# Patient Record
Sex: Male | Born: 1940 | Race: Black or African American | Hispanic: No | Marital: Married | Smoking: Former smoker
Health system: Southern US, Community
[De-identification: ages and names within clinical notes are randomized; demographics above are authoritative.]

## PROBLEM LIST (undated history)

## (undated) DIAGNOSIS — I10 Essential (primary) hypertension: Secondary | ICD-10-CM

---

## 1998-03-23 ENCOUNTER — Other Ambulatory Visit: Admission: RE | Admit: 1998-03-23 | Discharge: 1998-03-23 | Payer: Self-pay | Admitting: Family Medicine

## 2008-04-01 ENCOUNTER — Ambulatory Visit (HOSPITAL_COMMUNITY): Admission: RE | Admit: 2008-04-01 | Discharge: 2008-04-01 | Payer: Self-pay | Admitting: Family Medicine

## 2017-05-18 ENCOUNTER — Emergency Department (HOSPITAL_COMMUNITY): Payer: Medicare Other

## 2017-05-18 ENCOUNTER — Inpatient Hospital Stay (HOSPITAL_COMMUNITY): Payer: Medicare Other

## 2017-05-18 ENCOUNTER — Inpatient Hospital Stay (HOSPITAL_COMMUNITY)
Admission: EM | Admit: 2017-05-18 | Discharge: 2017-05-26 | DRG: 689 | Disposition: A | Discharge status: Home / Self Care | Payer: Medicare Other | Attending: Family Medicine | Admitting: Family Medicine

## 2017-05-18 ENCOUNTER — Encounter (HOSPITAL_COMMUNITY): Payer: Self-pay

## 2017-05-18 DIAGNOSIS — R32 Unspecified urinary incontinence: Secondary | ICD-10-CM

## 2017-05-18 DIAGNOSIS — R531 Weakness: Secondary | ICD-10-CM

## 2017-05-18 DIAGNOSIS — N178 Other acute kidney failure: Secondary | ICD-10-CM | POA: Diagnosis not present

## 2017-05-18 DIAGNOSIS — N184 Chronic kidney disease, stage 4 (severe): Secondary | ICD-10-CM | POA: Diagnosis present

## 2017-05-18 DIAGNOSIS — D62 Acute posthemorrhagic anemia: Secondary | ICD-10-CM | POA: Diagnosis not present

## 2017-05-18 DIAGNOSIS — Y92009 Unspecified place in unspecified non-institutional (private) residence as the place of occurrence of the external cause: Secondary | ICD-10-CM | POA: Diagnosis not present

## 2017-05-18 DIAGNOSIS — M1610 Unilateral primary osteoarthritis, unspecified hip: Secondary | ICD-10-CM | POA: Diagnosis present

## 2017-05-18 DIAGNOSIS — N179 Acute kidney failure, unspecified: Secondary | ICD-10-CM | POA: Diagnosis present

## 2017-05-18 DIAGNOSIS — I129 Hypertensive chronic kidney disease with stage 1 through stage 4 chronic kidney disease, or unspecified chronic kidney disease: Secondary | ICD-10-CM | POA: Diagnosis present

## 2017-05-18 DIAGNOSIS — R319 Hematuria, unspecified: Secondary | ICD-10-CM

## 2017-05-18 DIAGNOSIS — Y846 Urinary catheterization as the cause of abnormal reaction of the patient, or of later complication, without mention of misadventure at the time of the procedure: Secondary | ICD-10-CM | POA: Diagnosis present

## 2017-05-18 DIAGNOSIS — N3001 Acute cystitis with hematuria: Principal | ICD-10-CM | POA: Diagnosis present

## 2017-05-18 DIAGNOSIS — T8383XA Hemorrhage of genitourinary prosthetic devices, implants and grafts, initial encounter: Secondary | ICD-10-CM | POA: Diagnosis present

## 2017-05-18 DIAGNOSIS — B9620 Unspecified Escherichia coli [E. coli] as the cause of diseases classified elsewhere: Secondary | ICD-10-CM | POA: Diagnosis present

## 2017-05-18 DIAGNOSIS — W19XXXA Unspecified fall, initial encounter: Secondary | ICD-10-CM | POA: Diagnosis not present

## 2017-05-18 DIAGNOSIS — E43 Unspecified severe protein-calorie malnutrition: Secondary | ICD-10-CM | POA: Diagnosis present

## 2017-05-18 DIAGNOSIS — I10 Essential (primary) hypertension: Secondary | ICD-10-CM | POA: Diagnosis not present

## 2017-05-18 DIAGNOSIS — Z681 Body mass index (BMI) 19 or less, adult: Secondary | ICD-10-CM | POA: Diagnosis not present

## 2017-05-18 DIAGNOSIS — E860 Dehydration: Secondary | ICD-10-CM | POA: Diagnosis present

## 2017-05-18 DIAGNOSIS — Z79899 Other long term (current) drug therapy: Secondary | ICD-10-CM | POA: Diagnosis not present

## 2017-05-18 DIAGNOSIS — N3091 Cystitis, unspecified with hematuria: Secondary | ICD-10-CM

## 2017-05-18 DIAGNOSIS — L8990 Pressure ulcer of unspecified site, unspecified stage: Secondary | ICD-10-CM | POA: Insufficient documentation

## 2017-05-18 DIAGNOSIS — D696 Thrombocytopenia, unspecified: Secondary | ICD-10-CM | POA: Diagnosis not present

## 2017-05-18 DIAGNOSIS — E872 Acidosis: Secondary | ICD-10-CM | POA: Diagnosis present

## 2017-05-18 DIAGNOSIS — N390 Urinary tract infection, site not specified: Secondary | ICD-10-CM | POA: Diagnosis not present

## 2017-05-18 HISTORY — DX: Essential (primary) hypertension: I10

## 2017-05-18 LAB — CBC WITH DIFFERENTIAL/PLATELET
Basophils Absolute: 0 10*3/uL (ref 0.0–0.1)
Basophils Relative: 0 %
Eosinophils Absolute: 0 10*3/uL (ref 0.0–0.7)
Eosinophils Relative: 0 %
HCT: 31.7 % — ABNORMAL LOW (ref 39.0–52.0)
HEMOGLOBIN: 10.6 g/dL — AB (ref 13.0–17.0)
LYMPHS ABS: 0.9 10*3/uL (ref 0.7–4.0)
LYMPHS PCT: 12 %
MCH: 29.4 pg (ref 26.0–34.0)
MCHC: 33.4 g/dL (ref 30.0–36.0)
MCV: 87.8 fL (ref 78.0–100.0)
Monocytes Absolute: 0.4 10*3/uL (ref 0.1–1.0)
Monocytes Relative: 5 %
Neutro Abs: 6.1 10*3/uL (ref 1.7–7.7)
Neutrophils Relative %: 82 %
Platelets: 75 10*3/uL — ABNORMAL LOW (ref 150–400)
RBC: 3.61 MIL/uL — AB (ref 4.22–5.81)
RDW: 14 % (ref 11.5–15.5)
WBC: 7.4 10*3/uL (ref 4.0–10.5)

## 2017-05-18 LAB — URINALYSIS, ROUTINE W REFLEX MICROSCOPIC
Bilirubin Urine: NEGATIVE
Glucose, UA: NEGATIVE mg/dL
KETONES UR: NEGATIVE mg/dL
Nitrite: NEGATIVE
PROTEIN: 100 mg/dL — AB
SQUAMOUS EPITHELIAL / LPF: NONE SEEN
Specific Gravity, Urine: 1.012 (ref 1.005–1.030)
pH: 5 (ref 5.0–8.0)

## 2017-05-18 LAB — CBC
HEMATOCRIT: 33.9 % — AB (ref 39.0–52.0)
Hemoglobin: 11.4 g/dL — ABNORMAL LOW (ref 13.0–17.0)
MCH: 29.5 pg (ref 26.0–34.0)
MCHC: 33.6 g/dL (ref 30.0–36.0)
MCV: 87.8 fL (ref 78.0–100.0)
PLATELETS: 88 10*3/uL — AB (ref 150–400)
RBC: 3.86 MIL/uL — AB (ref 4.22–5.81)
RDW: 14.1 % (ref 11.5–15.5)
WBC: 9.8 10*3/uL (ref 4.0–10.5)

## 2017-05-18 LAB — COMPREHENSIVE METABOLIC PANEL
ALBUMIN: 3.6 g/dL (ref 3.5–5.0)
ALT: 15 U/L — AB (ref 17–63)
AST: 19 U/L (ref 15–41)
Alkaline Phosphatase: 60 U/L (ref 38–126)
Anion gap: 11 (ref 5–15)
BUN: 72 mg/dL — AB (ref 6–20)
CHLORIDE: 108 mmol/L (ref 101–111)
CO2: 17 mmol/L — ABNORMAL LOW (ref 22–32)
CREATININE: 5.27 mg/dL — AB (ref 0.61–1.24)
Calcium: 9.3 mg/dL (ref 8.9–10.3)
GFR calc Af Amer: 11 mL/min — ABNORMAL LOW (ref >60)
GFR, EST NON AFRICAN AMERICAN: 10 mL/min — AB (ref >60)
GLUCOSE: 94 mg/dL (ref 65–99)
POTASSIUM: 4.2 mmol/L (ref 3.5–5.1)
Sodium: 136 mmol/L (ref 135–145)
Total Bilirubin: 1.1 mg/dL (ref 0.3–1.2)
Total Protein: 6.9 g/dL (ref 6.5–8.1)

## 2017-05-18 LAB — MAGNESIUM: Magnesium: 2 mg/dL (ref 1.7–2.4)

## 2017-05-18 LAB — PHOSPHORUS: Phosphorus: 3.7 mg/dL (ref 2.5–4.6)

## 2017-05-18 MED ORDER — DEXTROSE 5 % IV SOLN
1.0000 g | INTRAVENOUS | Status: DC
  Administered 2017-05-19 – 2017-05-20 (×2): 1 g via INTRAVENOUS
  Filled 2017-05-18 (×2): qty 10

## 2017-05-18 MED ORDER — ONDANSETRON HCL 4 MG/2ML IJ SOLN: 4.0000 mg | Freq: Four times a day (QID) | INTRAMUSCULAR | Status: DC | PRN

## 2017-05-18 MED ORDER — ORAL CARE MOUTH RINSE
15.0000 mL | Freq: Two times a day (BID) | OROMUCOSAL | Status: DC
  Administered 2017-05-19 – 2017-05-26 (×14): 15 mL via OROMUCOSAL

## 2017-05-18 MED ORDER — ONDANSETRON HCL 4 MG PO TABS: 4.0000 mg | ORAL_TABLET | Freq: Four times a day (QID) | ORAL | Status: DC | PRN

## 2017-05-18 MED ORDER — ACETAMINOPHEN 325 MG PO TABS
650.0000 mg | ORAL_TABLET | Freq: Four times a day (QID) | ORAL | Status: DC | PRN
  Administered 2017-05-22: 650 mg via ORAL
  Filled 2017-05-18: qty 2

## 2017-05-18 MED ORDER — SODIUM CHLORIDE 0.9 % IV BOLUS (SEPSIS)
1000.0000 mL | Freq: Once | INTRAVENOUS | Status: AC
  Administered 2017-05-18: 1000 mL via INTRAVENOUS

## 2017-05-18 MED ORDER — AMLODIPINE BESYLATE 10 MG PO TABS
10.0000 mg | ORAL_TABLET | Freq: Every day | ORAL | Status: DC
  Administered 2017-05-18 – 2017-05-26 (×9): 10 mg via ORAL
  Filled 2017-05-18 (×9): qty 1

## 2017-05-18 MED ORDER — SODIUM CHLORIDE 0.9% FLUSH
3.0000 mL | Freq: Two times a day (BID) | INTRAVENOUS | Status: DC
  Administered 2017-05-19 – 2017-05-25 (×6): 3 mL via INTRAVENOUS

## 2017-05-18 MED ORDER — SODIUM CHLORIDE 0.9 % IV SOLN
INTRAVENOUS | Status: DC
  Administered 2017-05-18 – 2017-05-22 (×5): via INTRAVENOUS
  Administered 2017-05-22: 75 mL/h via INTRAVENOUS
  Administered 2017-05-23 – 2017-05-26 (×4): via INTRAVENOUS

## 2017-05-18 MED ORDER — DEXTROSE 5 % IV SOLN
1.0000 g | Freq: Once | INTRAVENOUS | Status: AC
  Administered 2017-05-18: 1 g via INTRAVENOUS
  Filled 2017-05-18: qty 10

## 2017-05-18 MED ORDER — CHLORHEXIDINE GLUCONATE 0.12 % MT SOLN
15.0000 mL | Freq: Two times a day (BID) | OROMUCOSAL | Status: DC
  Administered 2017-05-19 – 2017-05-25 (×11): 15 mL via OROMUCOSAL
  Filled 2017-05-18 (×12): qty 15

## 2017-05-18 MED ORDER — ACETAMINOPHEN 650 MG RE SUPP: 650.0000 mg | Freq: Four times a day (QID) | RECTAL | Status: DC | PRN

## 2017-05-18 NOTE — Care Management (Signed)
ED CM reviewed consult. CM to follow for discharge needs. Rubie Maidrystal Salome Hautala RN CCM

## 2017-05-18 NOTE — H&P (Signed)
History and Physical    Cristian Peerlbert Piccione RUE:[id number] DOB: 01/01/1941 DOA: 05/18/2017  Referring MD/NP/PA: Dr. Patria Maneampos  PCP: Patient, No Pcp Per    Patient coming from: home   Chief Complaint: fall  HPI: Cristian Gates is a 76 y.o. male with medical history significant for hypertension, osteoarthritis who [facility]. Patient has been feeling weak for past week or so prior to the admission. No shortness of breath, palpitations, chest pain. No fevers or chills. No abdominal pain, nausea or vomiting. No recent sick contacts.   ED Course: In ED, pt was hemodynamically stable. BP was 141.97, HR 118, RR 18. Blood work was significant for hemoglobin of 11.4, platelets of 88, CO2 17, creatinine 5.27. No previous Cr value available for comparison. CXR showed no acute cardiopulmonary findings. Right hip x ray did not show acute fractures other than osteoarthritis. No acute findings on CT head. Pt started on IV fluids for acute renal failure. UA showed large leukocytes, too numerous to count WBC and pt was started on IV rocephin. Of note, foley catheter was placed in ED and there was blood in foley catheter likely associated with trauma from foley.    Review of Systems:  Constitutional: Negative for fever, chills, diaphoresis, activity change, appetite change and fatigue.  HENT: Negative for ear pain, nosebleeds, congestion, facial swelling, rhinorrhea, neck pain, neck stiffness and ear discharge.   Eyes: Negative for pain, discharge, redness, itching and visual disturbance.  Respiratory: Negative for cough, choking, chest tightness, shortness of breath, wheezing and stridor.   Cardiovascular: Negative for chest pain, palpitations and leg swelling.  Gastrointestinal: Negative for abdominal distention.  Genitourinary: Negative for dysuria, urgency, frequency, hematuria, flank pain, decreased urine volume, difficulty urinating and dyspareunia.  Musculoskeletal: Negative for back  pain, joint swelling, arthralgias and gait problem.  Neurological: Negative for dizziness, tremors, seizures, syncope, facial asymmetry, speech difficulty, light-headedness, numbness and headaches.  Hematological: Negative for adenopathy. Does not bruise/bleed easily.  Psychiatric/Behavioral: Negative for hallucinations, behavioral problems, confusion, dysphoric mood, decreased concentration and agitation.   No past medical history on file.  No past surgical history on file.  Social history: No history of smoking or alcohol abuse or illegal drugs abuse   Ambulation: ambulates without assistance at baseline  No Known Allergies  Family history: Hypertension in mother   Prior to Admission medications   Medication Sig Start Date End Date Taking? Authorizing Provider  amLODipine (NORVASC) 10 MG tablet Take 10 mg by mouth daily.   Yes [provider]  atenolol-chlorthalidone (TENORETIC) 50-25 MG tablet Take 1 tablet by mouth daily.   Yes [provider]  naproxen (NAPROSYN) 500 MG tablet Take 500 mg by mouth 2 (two) times daily as needed (pain).   Yes [provider]    Physical Exam: Vitals:   05/18/17 0825 05/18/17 0900 05/18/17 1000 05/18/17 1030  BP: (!) 125/91 116/87 (!) 141/97 134/83  Pulse: (!) 118 (!) 108 (!) 108 (!) 104  Resp: 18 13 14 12   Temp: 97.7 F (36.5 C)     TempSrc: Oral     SpO2: 100% 100% 100% 100%    Constitutional: NAD, calm, comfortable Vitals:   05/18/17 0825 05/18/17 0900 05/18/17 1000 05/18/17 1030  BP: (!) 125/91 116/87 (!) 141/97 134/83  Pulse: (!) 118 (!) 108 (!) 108 (!) 104  Resp: 18 13 14 12   Temp: 97.7 F (36.5 C)     TempSrc: Oral     SpO2: 100% 100%  100% 100%   Eyes: PERRL, lids and conjunctivae normal ENMT: Mucous membranes are moist. Posterior pharynx clear of any exudate or lesions. Neck: normal, supple, no masses, no thyromegaly Respiratory: clear to auscultation bilaterally, no wheezing, no crackles. Normal  respiratory effort. No accessory muscle use.  Cardiovascular: Regular rate and rhythm, no murmurs / rubs / gallops. +1-2 LE pitting edema Abdomen: no tenderness, no masses palpated. No hepatosplenomegaly. Bowel sounds positive.  Musculoskeletal: no clubbing / cyanosis. No joint deformity upper and lower extremities. Good ROM, no contractures. Normal muscle tone.  Skin: scattered excoriations on LE bilaterally; onychomycosis  Neurologic: CN 2-12 grossly intact. Sensation intact, DTR normal. Strength 5/5 in all 4.  Psychiatric: Normal judgment and insight. Alert and oriented x 3. Normal mood.    Labs on Admission: I have personally reviewed following labs and imaging studies  CBC:  Recent Labs Lab 05/18/17 0950  WBC 9.8  HGB 11.4*  HCT 33.9*  MCV 87.8  PLT 88*   Basic Metabolic Panel:  Recent Labs Lab 05/18/17 0950  NA 136  K 4.2  CL 108  CO2 17*  GLUCOSE 94  BUN 72*  CREATININE 5.27*  CALCIUM 9.3   GFR: CrCl cannot be calculated (Unknown ideal weight.). Liver Function Tests:  Recent Labs Lab 05/18/17 0950  AST 19  ALT 15*  ALKPHOS 60  BILITOT 1.1  PROT 6.9  ALBUMIN 3.6   No results for input(s): LIPASE, AMYLASE in the last 168 hours. No results for input(s): AMMONIA in the last 168 hours. Coagulation Profile: No results for input(s): INR, PROTIME in the last 168 hours. Cardiac Enzymes: No results for input(s): CKTOTAL, CKMB, CKMBINDEX, TROPONINI in the last 168 hours. BNP (last 3 results) No results for input(s): PROBNP in the last 8760 hours. HbA1C: No results for input(s): HGBA1C in the last 72 hours. CBG: No results for input(s): GLUCAP in the last 168 hours. Lipid Profile: No results for input(s): CHOL, HDL, LDLCALC, TRIG, CHOLHDL, LDLDIRECT in the last 72 hours. Thyroid Function Tests: No results for input(s): TSH, T4TOTAL, FREET4, T3FREE, THYROIDAB in the last 72 hours. Anemia Panel: No results for input(s): VITAMINB12, FOLATE, FERRITIN, TIBC,  IRON, RETICCTPCT in the last 72 hours. Urine analysis:    Component Value Date/Time   COLORURINE YELLOW 05/18/2017 1025   APPEARANCEUR TURBID (A) 05/18/2017 1025   LABSPEC 1.012 05/18/2017 1025   PHURINE 5.0 05/18/2017 1025   GLUCOSEU NEGATIVE 05/18/2017 1025   HGBUR LARGE (A) 05/18/2017 1025   BILIRUBINUR NEGATIVE 05/18/2017 1025   KETONESUR NEGATIVE 05/18/2017 1025   PROTEINUR 100 (A) 05/18/2017 1025   NITRITE NEGATIVE 05/18/2017 1025   LEUKOCYTESUR LARGE (A) 05/18/2017 1025   Sepsis Labs: @LABRCNTIP (procalcitonin:4,lacticidven:4) )No results found for this or any previous visit (from the past 240 hour(s)).   Radiological Exams on Admission: Dg Chest 2 View  Result Date: 05/18/2017 CLINICAL DATA:  Witnessed fall at home. Generalize weakness. History of hypertension. EXAM: CHEST  2 VIEW COMPARISON:  None in PACs FINDINGS: The lungs are well-expanded and clear. The heart and pulmonary vascularity are normal. There is tortuosity of ascending and descending thoracic aorta. There is mural calcification in the aorta. The mediastinum is normal in width. There is no pleural effusion. The observed bony thorax exhibits no acute abnormality. IMPRESSION: There is no acute cardiopulmonary abnormality. Thoracic aortic atherosclerosis. Electronically Signed   By: David  [place] M.D.   On: 05/18/2017 11:27   Ct Head Wo Contrast  Result Date: 05/18/2017 CLINICAL DATA:  Frequent falls.  Generalized weakness. EXAM: CT HEAD WITHOUT CONTRAST TECHNIQUE: Contiguous axial images were obtained from the base of the skull through the vertex without intravenous contrast. COMPARISON:  None. FINDINGS: Brain: No evidence of acute infarction, hemorrhage, hydrocephalus, extra-axial collection or mass lesion/mass effect. Mild-to-moderate atrophy and mild chronic microvascular ischemic change in the cerebral white matter. Vascular: Atherosclerotic calcification.  No hyperdense vessel. Skull: Negative Sinuses/Orbits: Chronic  appearing right ethmoid sinusitis. IMPRESSION: 1. No acute finding 2. Atrophy and mild chronic microvascular ischemic change. Electronically Signed   By: Marnee Spring M.D.   On: 05/18/2017 11:16   Dg Hip Unilat W Or Wo Pelvis 2-3 Views Right  Result Date: 05/18/2017 CLINICAL DATA:  Right hip pain following a witnessed fall at home EXAM: DG HIP (WITH OR WITHOUT PELVIS) 2-3V RIGHT COMPARISON:  None in PACs FINDINGS: The patient is rotated on this study. The bones are subjectively adequately mineralized. There is mild symmetric narrowing of both hip joint spaces. There is no acute fracture or dislocation of the right hip. The femoral head, neck, intertrochanteric, and immediate sub trochanteric regions appear normal. IMPRESSION: There is no acute bony abnormality of the right hip. 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 Electronically Signed   By: David  [place] M.D.   On: 05/18/2017 11:29    EKG: pending   Assessment/Plan  Active Problems: Fall / Weakness  - S/p mechanical fall at home - No acute fractures on right hip x ray - No acute findings on CT head  - Obtain PT/OT eval  UTI / Hematuria - UA showed large leukocytes as well as hematuria (hematuria thought to be from foley induced trauma) - Please continue to monitor for resolution of hematuria - Obtain renal ultrasound; cannot do CT scan due to renal insufficiency  - Started rocephin - Follow up urine cx results   ARF (acute renal failure) (HCC) - Likely due to dehydration, tenoretic (placed on hold), UTI - Continue IV fluids - Follow up BMP in am  Essential hypertension - Continue atenolol but hold tenoretic due to renal insufficiency    DVT prophylaxis: SCD's  Code Status: full code  Family Communication: no family at the bedside  Disposition Plan: admission to telemetry  Consults called: none Admission status: inpatient, pt clinically dehydrated, had acute renal failure,  need IV fluids and repeat BMP to follow up on renal function trend. Also, pt needs renal US for evaluation of hematuria.   Manson Passey MD [facility] Pager [phone]  If 7PM-7AM, please contact night-coverage [email or web address] Password [email or web address]  05/18/2017, 1:21 PM

## 2017-05-18 NOTE — ED Notes (Signed)
Bed: [id number] Expected date:  Expected time:  Means of arrival:  Comments: EMS-weak/fall

## 2017-05-18 NOTE — ED Provider Notes (Signed)
[facility]-EMERGENCY DEPT Provider Note   CSN: [id number] Arrival date & time: 05/18/17  0818     History   Chief Complaint Chief Complaint  Patient presents with  . Weakness  . Fall    HPI Cristian Gates is a 76 y.o. male.  HPI Patient is a 76 year old male who is brought to the emergency department by his wife and daughter after experiencing a witnessed fall at home.  Family reports the patient is become increasing weak since March 2018 has been having some loose stool.  He's been having more difficulty walking over the past several weeks and appears to be in a bent over position when he walks.  Family is noted near urinary incontinence as well with area of skin irritation from urinary incontinence on his medial thigh and right gluteal region.  No reported head injury.  No increasing confusion.  Family reports the patient is very guarded in terms of his history and does not seem to tell the family much about how he is feeling.  Patient denies chest pain.  Reports cough.  Symptoms are moderate to severe in severity.   No past medical history on file.  There are no active problems to display for this patient.   No past surgical history on file.     Home Medications    Prior to Admission medications   Medication Sig Start Date End Date Taking? Authorizing Provider  amLODipine (NORVASC) 10 MG tablet Take 10 mg by mouth daily.   Yes [provider]  atenolol-chlorthalidone (TENORETIC) 50-25 MG tablet Take 1 tablet by mouth daily.   Yes [provider]  naproxen (NAPROSYN) 500 MG tablet Take 500 mg by mouth 2 (two) times daily as needed (pain).   Yes [provider]    Family History No family history on file.  Social History Social History  Substance Use Topics  . Smoking status: Not on file  . Smokeless tobacco: Not on file  . Alcohol use Not on file     Allergies   Patient has no known allergies.   Review of Systems Review of Systems  All  other systems reviewed and are negative.    Physical Exam Updated Vital Signs BP 134/83   Pulse (!) 104   Temp 97.7 F (36.5 C) (Oral)   Resp 12   SpO2 100%   Physical Exam  Constitutional: He is oriented to person, place, and time. He appears well-developed and well-nourished.  Somewhat disheveled  HENT:  Head: Normocephalic and atraumatic.  Dry mucous membranes  Eyes: EOM are normal.  Neck: Normal range of motion. Neck supple.  Cardiovascular: Regular rhythm and normal heart sounds.   Tachycardia  Pulmonary/Chest: Effort normal and breath sounds normal. No respiratory distress.  Abdominal: Soft. He exhibits no distension. There is no tenderness.  Musculoskeletal: Normal range of motion. He exhibits no edema.  Lymphadenopathy:    He has no cervical adenopathy.  Neurological: He is alert and oriented to person, place, and time.  Skin: Skin is warm and dry.  Dermatitis of his R medial thigh  Psychiatric: He has a normal mood and affect. Judgment normal.  Nursing note and vitals reviewed.    ED Treatments / Results  Labs (all labs ordered are listed, but only abnormal results are displayed) Labs Reviewed  CBC - Abnormal; Notable for the following:       Result Value   RBC 3.86 (*)    Hemoglobin 11.4 (*)    HCT 33.9 (*)  Platelets 88 (*)    All other components within normal limits  COMPREHENSIVE METABOLIC PANEL - Abnormal; Notable for the following:    CO2 17 (*)    BUN 72 (*)    Creatinine, Ser 5.27 (*)    ALT 15 (*)    GFR calc non Af Amer 10 (*)    GFR calc Af Amer 11 (*)    All other components within normal limits  URINALYSIS, ROUTINE W REFLEX MICROSCOPIC - Abnormal; Notable for the following:    APPearance TURBID (*)    Hgb urine dipstick LARGE (*)    Protein, ur 100 (*)    Leukocytes, UA LARGE (*)    Bacteria, UA MANY (*)    All other components within normal limits  URINE CULTURE    EKG  EKG Interpretation  Date/Time:  Thursday May 18 2017  09:37:58 EDT Ventricular Rate:  115 PR Interval:    QRS Duration: 89 QT Interval:  328 QTC Calculation: 454 R Axis:   -18 Text Interpretation:  Sinus tachycardia Multiple ventricular premature complexes Borderline left axis deviation Low voltage, extremity leads Abnormal R-wave progression, early transition No old tracing to compare Confirmed by Azalia Bilis ([id number]) on 05/18/2017 1:01:50 PM       Radiology Dg Chest 2 View  Result Date: 05/18/2017 CLINICAL DATA:  Witnessed fall at home. Generalize weakness. History of hypertension. EXAM: CHEST  2 VIEW COMPARISON:  None in PACs FINDINGS: The lungs are well-expanded and clear. The heart and pulmonary vascularity are normal. There is tortuosity of ascending and descending thoracic aorta. There is mural calcification in the aorta. The mediastinum is normal in width. There is no pleural effusion. The observed bony thorax exhibits no acute abnormality. IMPRESSION: There is no acute cardiopulmonary abnormality. Thoracic aortic atherosclerosis. Electronically Signed   By: David  [place] M.D.   On: 05/18/2017 11:27   Ct Head Wo Contrast  Result Date: 05/18/2017 CLINICAL DATA:  Frequent falls.  Generalized weakness. EXAM: CT HEAD WITHOUT CONTRAST TECHNIQUE: Contiguous axial images were obtained from the base of the skull through the vertex without intravenous contrast. COMPARISON:  None. FINDINGS: Brain: No evidence of acute infarction, hemorrhage, hydrocephalus, extra-axial collection or mass lesion/mass effect. Mild-to-moderate atrophy and mild chronic microvascular ischemic change in the cerebral white matter. Vascular: Atherosclerotic calcification.  No hyperdense vessel. Skull: Negative Sinuses/Orbits: Chronic appearing right ethmoid sinusitis. IMPRESSION: 1. No acute finding 2. Atrophy and mild chronic microvascular ischemic change. Electronically Signed   By: Marnee Spring M.D.   On: 05/18/2017 11:16   Dg Hip Unilat W Or Wo Pelvis 2-3 Views  Right  Result Date: 05/18/2017 CLINICAL DATA:  Right hip pain following a witnessed fall at home EXAM: DG HIP (WITH OR WITHOUT PELVIS) 2-3V RIGHT COMPARISON:  None in PACs FINDINGS: The patient is rotated on this study. The bones are subjectively adequately mineralized. There is mild symmetric narrowing of both hip joint spaces. There is no acute fracture or dislocation of the right hip. The femoral head, neck, intertrochanteric, and immediate sub trochanteric regions appear normal. IMPRESSION: There is no acute bony abnormality of the right hip. 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 Electronically Signed   By: David  [place] M.D.   On: 05/18/2017 11:29    Procedures Procedures (including critical care time)  Medications Ordered in ED Medications  cefTRIAXone (ROCEPHIN) 1 g in dextrose 5 % 50 mL IVPB (1 g Intravenous New Bag/Given 05/18/17 1239)  sodium chloride  0.9 % bolus 1,000 mL (0 mLs Intravenous Stopped 05/18/17 1239)     Initial Impression / Assessment and Plan / ED Course  I have reviewed the triage vital signs and the nursing notes.  Pertinent labs & imaging results that were available during my care of the patient were reviewed by me and considered in my medical decision making (see chart for details).     No prior creatinine available. Elevated BUN/creatinine. UTI. Post void residual 200cc. Will place foley for acute urinary retention as urinary incontinence is new and may be causing his acute renal failure. Rocephin given. Urine culture sent  Final Clinical Impressions(s) / ED Diagnoses   Final diagnoses:  Acute renal failure, unspecified acute renal failure type (HCC)  Acute cystitis with hematuria  Urinary incontinence, unspecified type    New Prescriptions New Prescriptions   No medications on file     Azalia Bilisampos, [place], MD 05/19/17 [phone]

## 2017-05-18 NOTE — ED Triage Notes (Signed)
Per EMS, pt is coming from home after experiencing a witnessed fall at home. Pt reports being weak since March. Pt and pt family reports that "everything he eats goes straight through him". Pt is unable to walk at this time. Pt has a hx of gout and hypertension. Pt is AO x4.

## 2017-05-19 DIAGNOSIS — N3001 Acute cystitis with hematuria: Principal | ICD-10-CM

## 2017-05-19 LAB — BASIC METABOLIC PANEL
Anion gap: 11 (ref 5–15)
BUN: 61 mg/dL — AB (ref 6–20)
CHLORIDE: 111 mmol/L (ref 101–111)
CO2: 14 mmol/L — AB (ref 22–32)
CREATININE: 4.34 mg/dL — AB (ref 0.61–1.24)
Calcium: 8.6 mg/dL — ABNORMAL LOW (ref 8.9–10.3)
GFR calc Af Amer: 14 mL/min — ABNORMAL LOW (ref >60)
GFR calc non Af Amer: 12 mL/min — ABNORMAL LOW (ref >60)
Glucose, Bld: 76 mg/dL (ref 65–99)
Potassium: 3.9 mmol/L (ref 3.5–5.1)
Sodium: 136 mmol/L (ref 135–145)

## 2017-05-19 LAB — CBC
HCT: 28 % — ABNORMAL LOW (ref 39.0–52.0)
Hemoglobin: 9.5 g/dL — ABNORMAL LOW (ref 13.0–17.0)
MCH: 29.3 pg (ref 26.0–34.0)
MCHC: 33.9 g/dL (ref 30.0–36.0)
MCV: 86.4 fL (ref 78.0–100.0)
PLATELETS: 66 10*3/uL — AB (ref 150–400)
RBC: 3.24 MIL/uL — ABNORMAL LOW (ref 4.22–5.81)
RDW: 14.1 % (ref 11.5–15.5)
WBC: 6.9 10*3/uL (ref 4.0–10.5)

## 2017-05-19 LAB — GLUCOSE, CAPILLARY: Glucose-Capillary: 77 mg/dL (ref 65–99)

## 2017-05-19 MED ORDER — LABETALOL HCL 5 MG/ML IV SOLN
5.0000 mg | INTRAVENOUS | Status: DC | PRN
  Filled 2017-05-19: qty 4

## 2017-05-19 NOTE — NC FL2 (Signed)
[place] MEDICAID FL2 LEVEL OF CARE SCREENING TOOL     IDENTIFICATION  Patient Name: Cristian Gates Birthdate: [birth year] Sex: male Admission Date (Current Location): 05/18/2017  [facility] [place]   Facility and Address:  [facility],  [address], [place] [id number]      Provider Number: [id number]  Attending Physician Name and Address:  Jerald Kiefhiu, Stephen K, MD  Relative Name and Phone Number:       Current Level of Care: Hospital Recommended Level of Care: Skilled Nursing Facility Prior Approval Number:    Date Approved/Denied:   PASRR Number: [id number]  Discharge Plan: SNF    Current Diagnoses: Patient Active Problem List   Diagnosis Date Noted  . ARF (acute renal failure) (HCC) 05/18/2017  . Generalized weakness 05/18/2017  . Fall at home, initial encounter 05/18/2017  . Essential hypertension 05/18/2017  . Acute lower UTI 05/18/2017    Orientation RESPIRATION BLADDER Height & Weight     Self, Time, Situation, Place  Normal Indwelling catheter, Continent Weight: 142 lb 6.7 oz (64.6 kg) Height:  6' (182.9 cm)  BEHAVIORAL SYMPTOMS/MOOD NEUROLOGICAL BOWEL NUTRITION STATUS      Continent    AMBULATORY STATUS COMMUNICATION OF NEEDS Skin   Extensive Assist Verbally PU Stage and Appropriate Care, Skin abrasions, Bruising   PU Stage 2 Dressing: (S)  (PRN)                   Personal Care Assistance Level of Assistance  Bathing, Feeding, Dressing Bathing Assistance: Maximum assistance Feeding assistance: Maximum assistance Dressing Assistance: Maximum assistance     Functional Limitations Info  Sight Sight Info: Impaired        SPECIAL CARE FACTORS FREQUENCY  PT (By licensed PT)     PT Frequency: Min 3x/week              Contractures Contractures Info: Not present    Additional Factors Info                  Current Medications (05/19/2017):  This is the current hospital active medication list Current  Facility-Administered Medications  Medication Dose Route Frequency Provider Last Rate Last Dose  . 0.9 %  sodium chloride infusion   Intravenous Continuous Alison Murrayevine, Alma M, MD 75 mL/hr at 05/19/17 [phone]    . acetaminophen (TYLENOL) tablet 650 mg  650 mg Oral Q6H PRN Alison Murrayevine, Alma M, MD       Or  . acetaminophen (TYLENOL) suppository 650 mg  650 mg Rectal Q6H PRN Alison Murrayevine, Alma M, MD      . amLODipine (NORVASC) tablet 10 mg  10 mg Oral Daily Alison Murrayevine, Alma M, MD   10 mg at 05/19/17 0851  . cefTRIAXone (ROCEPHIN) 1 g in dextrose 5 % 50 mL IVPB  1 g Intravenous Q24H Alison Murrayevine, Alma M, MD   Stopped at 05/19/17 1231  . chlorhexidine (PERIDEX) 0.12 % solution 15 mL  15 mL Mouth Rinse BID Alison Murrayevine, Alma M, MD   15 mL at 05/19/17 [phone]  . MEDLINE mouth rinse  15 mL Mouth Rinse q12n4p Alison Murrayevine, Alma M, MD   15 mL at 05/19/17 1201  . ondansetron (ZOFRAN) tablet 4 mg  4 mg Oral Q6H PRN Alison Murrayevine, Alma M, MD       Or  . ondansetron [facility](ZOFRAN) injection 4 mg  4 mg Intravenous Q6H PRN Alison Murrayevine, Alma M, MD      . sodium chloride flush (NS) 0.9 % injection 3  mL  3 mL Intravenous Q12H Alison Murray, MD   3 mL at 05/19/17 1610     Discharge Medications: Please see discharge summary for a list of discharge medications.  Relevant Imaging Results:  Relevant Lab Results:   Additional Information SSN [id number]  Marfa, Lavonne Chick D, [place]

## 2017-05-19 NOTE — Progress Notes (Signed)
Spoke with pt's wife and daughter at bedside, discharge plan is for SNF. CSW aware.

## 2017-05-19 NOTE — Progress Notes (Signed)
PROGRESS NOTE    Cristian Gates  ZOX:[id number] DOB: [birth year] DOA: 05/18/2017 PCP: Patient, No Pcp Per    Brief Narrative:  76 y.o. male with medical history significant for hypertension, osteoarthritis who presented to [facility] status post fall at home. Patient has been feeling weak for past week or so prior to the admission. No shortness of breath, palpitations, chest pain. No fevers or chills. No abdominal pain, nausea or vomiting. No recent sick contacts.   ED Course: In ED, pt was hemodynamically stable. BP was 141.97, HR 118, RR 18. Blood work was significant for hemoglobin of 11.4, platelets of 88, CO2 17, creatinine 5.27. No previous Cr value available for comparison. CXR showed no acute cardiopulmonary findings. Right hip x ray did not show acute fractures other than osteoarthritis. No acute findings on CT head. Pt started on IV fluids for acute renal failure. UA showed large leukocytes, too numerous to count WBC and pt was started on IV rocephin. Of note, foley catheter was placed in ED and there was blood in foley catheter likely associated with trauma from foley.   Assessment & Plan:   Principal Problem:   Fall at home, initial encounter Active Problems:   ARF (acute renal failure) (HCC)   Generalized weakness   Essential hypertension   Acute lower UTI   1. Renal failure, unknown if chronic or acute as there is no baseline Cr 1. Unknown baseline renal function 2. Presenting Cr of 5.27 3. Patient continued on IVF hydration with repeat Cr of 4.34 4. Pt is voiding 5. Will repeat bmet in AM. If no improvement or if worsens, then would consider formal Nephrology consultation 6. Discussed with family and patient, who are in agreement with HD if needed 2. HTN 1. BP stable at this time 2. Cont on atenolol as tolerated 3. Weakness 1. PT consulted 2. Therapy recs for SNF at time of d/c 4. Thrombocytopenia 1. Unclear etiology 2. Will repeat CBC in AM 5. Metabolic acidosis 1. Likely  secondary to presenting renal failure 2. Cont IVF hydration 3. Repeat renal function in AM 6. Osteoarthritis 1. Hip xrays reviewed. No fracture, however findings c/w osteoarthritis 2. Avoid NSAIDs given poor renal function 3. PT consulted per above 7. Sinus tach 1. HR in the 100's today 2. On atenolol 3. Will order PRN labetalol IV for HR>120 with hold parameters  8. Hematuria 1. Suspected foley trauma 2. Will continue indwelling foley cath  DVT prophylaxis: SCD's Code Status: Full Family Communication: Pt in room, grand daughter at bedside, daughter over phone Disposition Plan: SNF, timing uncertain  Consultants:     Procedures:     Antimicrobials: Anti-infectives    Start     Dose/Rate Route Frequency Ordered Stop   05/19/17 1200  cefTRIAXone (ROCEPHIN) 1 g in dextrose 5 % 50 mL IVPB     1 g 100 mL/hr over 30 Minutes Intravenous Every 24 hours 05/18/17 1350     05/18/17 1215  cefTRIAXone (ROCEPHIN) 1 g in dextrose 5 % 50 mL IVPB     1 g 100 mL/hr over 30 Minutes Intravenous  Once 05/18/17 1209 05/18/17 1344       Subjective: No complaints  Objective: Vitals:   05/18/17 1700 05/18/17 2206 05/19/17 0701 05/19/17 1348  BP: (!) 141/77 130/71 140/80 139/83  Pulse: (!) 115 (!) 110 (!) 107 (!) 112  Resp: 14 15 16 16   Temp: 97.8 F (36.6 C) 97.5 F (36.4 C) 98.1 F (36.7 C) 97.7 F (36.5 C)  TempSrc: Axillary Oral Oral Oral  SpO2: 100% 100% 100% 100%  Weight: 60.3 kg (132 lb 15 oz)  64.6 kg (142 lb 6.7 oz)   Height: 6' (1.829 m)       Intake/Output Summary (Last 24 hours) at 05/19/17 1740 Last data filed at 05/19/17 1400  Gross per 24 hour  Intake           1827.5 ml  Output             2300 ml  Net           -472.5 ml   Filed Weights   05/18/17 1700 05/19/17 0701  Weight: 60.3 kg (132 lb 15 oz) 64.6 kg (142 lb 6.7 oz)    Examination:  General exam: Appears calm and comfortable  Respiratory system: Clear to auscultation. Respiratory effort  normal. Cardiovascular system: S1 & S2 heard, RRR. No JVD, murmurs, rubs, gallops or clicks. No pedal edema. Gastrointestinal system: Abdomen is nondistended, soft and nontender. No organomegaly or masses felt. Normal bowel sounds heard. Central nervous system: Alert and oriented. No focal neurological deficits. Extremities: Symmetric 5 x 5 power. Skin: No rashes, lesions Psychiatry: Judgement and insight appear normal. Mood & affect appropriate.   Data Reviewed: I have personally reviewed following labs and imaging studies  CBC:  Recent Labs Lab 05/18/17 0950 05/18/17 1849 05/19/17 0526  WBC 9.8 7.4 6.9  NEUTROABS  --  6.1  --   HGB 11.4* 10.6* 9.5*  HCT 33.9* 31.7* 28.0*  MCV 87.8 87.8 86.4  PLT 88* 75* 66*   Basic Metabolic Panel:  Recent Labs Lab 05/18/17 0950 05/18/17 1849 05/19/17 0526  NA 136  --  136  K 4.2  --  3.9  CL 108  --  111  CO2 17*  --  14*  GLUCOSE 94  --  76  BUN 72*  --  61*  CREATININE 5.27*  --  4.34*  CALCIUM 9.3  --  8.6*  MG  --  2.0  --   PHOS  --  3.7  --    GFR: Estimated Creatinine Clearance: 13.6 mL/min (A) (by C-G formula based on SCr of 4.34 mg/dL (H)). Liver Function Tests:  Recent Labs Lab 05/18/17 0950  AST 19  ALT 15*  ALKPHOS 60  BILITOT 1.1  PROT 6.9  ALBUMIN 3.6   No results for input(s): LIPASE, AMYLASE in the last 168 hours. No results for input(s): AMMONIA in the last 168 hours. Coagulation Profile: No results for input(s): INR, PROTIME in the last 168 hours. Cardiac Enzymes: No results for input(s): CKTOTAL, CKMB, CKMBINDEX, TROPONINI in the last 168 hours. BNP (last 3 results) No results for input(s): PROBNP in the last 8760 hours. HbA1C: No results for input(s): HGBA1C in the last 72 hours. CBG:  Recent Labs Lab 05/19/17 0733  GLUCAP 77   Lipid Profile: No results for input(s): CHOL, HDL, LDLCALC, TRIG, CHOLHDL, LDLDIRECT in the last 72 hours. Thyroid Function Tests: No results for input(s): TSH,  T4TOTAL, FREET4, T3FREE, THYROIDAB in the last 72 hours. Anemia Panel: No results for input(s): VITAMINB12, FOLATE, FERRITIN, TIBC, IRON, RETICCTPCT in the last 72 hours. Sepsis Labs: No results for input(s): PROCALCITON, LATICACIDVEN in the last 168 hours.  Recent Results (from the past 240 hour(s))  Urine culture     Status: Abnormal (Preliminary result)   Collection Time: 05/18/17 10:25 AM  Result Value Ref Range Status   Specimen Description URINE, RANDOM  Final   Special  Requests NONE  Final   Culture >=100,000 COLONIES/mL ESCHERICHIA COLI (A)  Final   Report Status PENDING  Incomplete     Radiology Studies: Dg Chest 2 View  Result Date: 05/18/2017 CLINICAL DATA:  Witnessed fall at home. Generalize weakness. History of hypertension. EXAM: CHEST  2 VIEW COMPARISON:  None in PACs FINDINGS: The lungs are well-expanded and clear. The heart and pulmonary vascularity are normal. There is tortuosity of ascending and descending thoracic aorta. There is mural calcification in the aorta. The mediastinum is normal in width. There is no pleural effusion. The observed bony thorax exhibits no acute abnormality. IMPRESSION: There is no acute cardiopulmonary abnormality. Thoracic aortic atherosclerosis. Electronically Signed   By: David  [place] M.D.   On: 05/18/2017 11:27   Ct Head Wo Contrast  Result Date: 05/18/2017 CLINICAL DATA:  Frequent falls.  Generalized weakness. EXAM: CT HEAD WITHOUT CONTRAST TECHNIQUE: Contiguous axial images were obtained from the base of the skull through the vertex without intravenous contrast. COMPARISON:  None. FINDINGS: Brain: No evidence of acute infarction, hemorrhage, hydrocephalus, extra-axial collection or mass lesion/mass effect. Mild-to-moderate atrophy and mild chronic microvascular ischemic change in the cerebral white matter. Vascular: Atherosclerotic calcification.  No hyperdense vessel. Skull: Negative Sinuses/Orbits: Chronic appearing right ethmoid sinusitis.  IMPRESSION: 1. No acute finding 2. Atrophy and mild chronic microvascular ischemic change. Electronically Signed   By: Marnee Spring M.D.   On: 05/18/2017 11:16   US Renal  Result Date: 05/18/2017 CLINICAL DATA:  Hematuria EXAM: RENAL / URINARY TRACT ULTRASOUND COMPLETE COMPARISON:  CT 04/01/2008 FINDINGS: Right Kidney: Length: 8.9 cm. Slightly atrophic. Two cysts in the midpole with the larger measuring 3.5 cm in maximum diameter. There is no hydronephrosis Left Kidney: Length: 8.6 cm. Slightly atrophic. Cysts in the mid to lower pole measuring 1.5 cm. Bladder: Foley catheter is present in the bladder. There appears to be echogenic material within the posterior bladder. IMPRESSION: 1. Slightly atrophic kidneys. No hydronephrosis. Bilateral renal cysts. 2. Suspect echogenic material in the posterior bladder which may represent debris or possible thrombus. Electronically Signed   By: Jasmine Pang M.D.   On: 05/18/2017 19:58   Dg Hip Unilat W Or Wo Pelvis 2-3 Views Right  Result Date: 05/18/2017 CLINICAL DATA:  Right hip pain following a witnessed fall at home EXAM: DG HIP (WITH OR WITHOUT PELVIS) 2-3V RIGHT COMPARISON:  None in PACs FINDINGS: The patient is rotated on this study. The bones are subjectively adequately mineralized. There is mild symmetric narrowing of both hip joint spaces. There is no acute fracture or dislocation of the right hip. The femoral head, neck, intertrochanteric, and immediate sub trochanteric regions appear normal. IMPRESSION: There is no acute bony abnormality of the right hip. 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 Electronically Signed   By: David  [place] M.D.   On: 05/18/2017 11:29    Scheduled Meds: . amLODipine  10 mg Oral Daily  . chlorhexidine  15 mL Mouth Rinse BID  . mouth rinse  15 mL Mouth Rinse q12n4p  . sodium chloride flush  3 mL Intravenous Q12H   Continuous Infusions: . sodium chloride 75 mL/hr at  05/19/17 0605  . cefTRIAXone (ROCEPHIN)  IV Stopped (05/19/17 1231)     LOS: 1 day   Anamari Galeas, Scheryl Marten, MD [facility] Pager [phone]  If 7PM-7AM, please contact night-coverage [email or web address] Password [email or web address] 05/19/2017, 5:40 PM

## 2017-05-19 NOTE — Clinical Social Work Note (Signed)
Clinical Social Work Assessment  Patient Details  Name: Cristian Gates MRN: [id number] Date of Birth: [birth year]  Date of referral:  05/18/17               Reason for consult:  Facility Placement                Permission sought to share information with:  Family Supports Permission granted to share information::  Yes, Verbal Permission Granted  Name::     Cristian Gates  Agency::     Relationship::  Geophysical data processor Information:  [phone]  Housing/Transportation Living arrangements for the past 2 months:  Single Family Home Source of Information:  Patient, Spouse Patient Interpreter Needed:  None Criminal Activity/Legal Involvement Pertinent to Current Situation/Hospitalization:  No - Comment as needed Significant Relationships:  Adult Children, Other Family Members, Spouse Lives with:  Self, Spouse Do you feel safe going back to the place where you live?  Yes Need for family participation in patient care:  Yes (Comment)  Care giving concerns:  SNF Placement, weakness   Social Worker assessment / plan:  CSW met with pt and family and introduced CSW role.  Pt immediately stated he was not going to STR because he didn't want to leave his wife of 72 years.  Pt and wife live in the same home.  Extended family members live locally and are very supportive.  Wife stated she could not take care of pt and stated he needed to go to STR.  CSW explained how referral to SNF works.  Pt stated he was willing to go if absolutely necessary and was willing for CSW to initiate SNF search.  CSW provided handout of local facilities and initiated SNF search.  CSW will continue to follow and assist with d/c planning needs.  Employment status:  Retired Nurse, adult PT Recommendations:  [place] / Referral to community resources:  [place]  Patient/Family's Response to care:  Pt and family were appreciative of CSW  assistance.  Patient/Family's Understanding of and Emotional Response to Diagnosis, Current Treatment, and Prognosis:  Pt was initially not open to going to STR.  As family expressed their concern he became more willing to consider going to SNF.  He acknowledged that he needs to regain strength.  Emotional Assessment Appearance:  Appears stated age Attitude/Demeanor/Rapport:  Other (Cooperative) Affect (typically observed):  Accepting, Apprehensive, Pleasant Orientation:  Oriented to Self, Oriented to Place, Oriented to  Time, Oriented to Situation Alcohol / Substance use:  Not Applicable Psych involvement (Current and /or in the community):  No (Comment)  Discharge Needs  Concerns to be addressed:  Discharge Planning Concerns Readmission within the last 30 days:    Current discharge risk:  None Barriers to Discharge:  Continued Medical Work up   [place], [place], [place] 05/19/2017, 1:55 PM

## 2017-05-19 NOTE — Evaluation (Signed)
Physical Therapy Evaluation Patient Details Name: Cristian Gates Derenzo MRN: [id number] DOB: 24-Mar-1941 Today's Date: 05/19/2017   History of Present Illness  Cristian Gates Anctil is a 76 y.o. male with medical history significant for hypertension, osteoarthritis [facility]. Patient has been feeling weak . negative for fracture of right hip. Blood in  foley catheter.  Clinical Impression  The patient required significant assistance of 2 persons to mobilize partially to sitting at the bed edge. The patient could not tolerate sitting due to complaints of pain of periarea. Pt admitted with above diagnosis. Pt currently with functional limitations due to the deficits listed below (see PT Problem List).  Pt will benefit from skilled PT to increase their independence and safety with mobility to allow discharge to the venue listed below.      Follow Up Recommendations SNF;Supervision/Assistance - 24 hour    Equipment Recommendations  None recommended by PT    Recommendations for Other Services       Precautions / Restrictions Precautions Precautions: Fall Restrictions Weight Bearing Restrictions: No      Mobility  Bed Mobility Overal bed mobility: Needs Assistance Bed Mobility: Supine to Sit;Sit to Supine     Supine to sit: HOB elevated;+2 for physical assistance;+2 for safety/equipment;Total assist Sit to supine: +2 for physical assistance;+2 for safety/equipment;Total assist   General bed mobility comments: used bed pad to slide to bed edge. Patient unable to fully  sit up due to discomfort of periarea. required total assistance of 2 to return to supine  Transfers                 General transfer comment: NT  Ambulation/Gait                Stairs            Wheelchair Mobility    Modified Rankin (Stroke Patients Only)       Balance Overall balance assessment: Needs assistance;History of Falls Sitting-balance support: Feet  unsupported Sitting balance-Leahy Scale: Zero Sitting balance - Comments: could not sit up                                     Pertinent Vitals/Pain Pain Assessment: Faces Faces Pain Scale: Hurts whole lot Pain Location: periarea sitting up Pain Descriptors / Indicators: Discomfort;Grimacing;Moaning Pain Intervention(s): Repositioned    Home Living Family/patient expects to be discharged to:: Skilled nursing facility Living Arrangements: Spouse/significant other Available Help at Discharge: Family;Available 24 hours/day Type of Home: House Home Access: Stairs to enter Entrance Stairs-Rails: None Entrance Stairs-Number of Steps: 2 Home Layout: One level Home Equipment: Environmental consultantWalker - 2 wheels Additional Comments: family states wife is unable to assist patient physically, has relied on multiple family members    Prior Function Level of Independence: Needs assistance   Gait / Transfers Assistance Needed: per granddaughter, patient ambulated hunched over and required assistance, last time he amb was 2-3 days PTA.   ADL's / Homemaking Assistance Needed: assist for short distance to ambulate to BR        Hand Dominance        Extremity/Trunk Assessment   Upper Extremity Assessment Upper Extremity Assessment: RUE deficits/detail;LUE deficits/detail RUE Deficits / Details: noted intrinsic wasting of hand, grossly 3+ shoulder elevation LUE Deficits / Details: same as right.    Lower Extremity Assessment Lower Extremity Assessment: LLE deficits/detail;RLE deficits/detail RLE Deficits /  Details: right leg significantly larger/edema of entire leg, patient could not relate how long this has been LLE Deficits / Details: noted some wasting of the calf, smaller than right    Cervical / Trunk Assessment Cervical / Trunk Assessment: Other exceptions Cervical / Trunk Exceptions: difficult to assesss as patient  did not sit up fully  Communication      Cognition  Arousal/Alertness: Awake/alert Behavior During Therapy: WFL for tasks assessed/performed Overall Cognitive Status: Difficult to assess                                 General Comments: speach is difficult, dgives different info than granddaughter re: PLOF      General Comments      Exercises     Assessment/Plan    PT Assessment Patient needs continued PT services  PT Problem List Decreased strength;Decreased activity tolerance;Decreased balance;Decreased mobility;Decreased knowledge of precautions;Decreased safety awareness;Decreased knowledge of use of DME;Pain       PT Treatment Interventions Functional mobility training;Gait training;DME instruction;Therapeutic activities;Therapeutic exercise;Patient/family education    PT Goals (Current goals can be found in the Care Plan section)  Acute Rehab PT Goals Patient Stated Goal: to go home PT Goal Formulation: With patient/family Time For Goal Achievement: 06/02/17 Potential to Achieve Goals: Fair    Frequency Min 3X/week   Barriers to discharge Decreased caregiver support      Co-evaluation               AM-PAC PT "6 Clicks" Daily Activity  Outcome Measure Difficulty turning over in bed (including adjusting bedclothes, sheets and blankets)?: Total Difficulty moving from lying on back to sitting on the side of the bed? : Total Difficulty sitting down on and standing up from a chair with arms (e.g., wheelchair, bedside commode, etc,.)?: Total Help needed moving to and from a bed to chair (including a wheelchair)?: Total Help needed walking in hospital room?: Total Help needed climbing 3-5 steps with a railing? : Total 6 Click Score: 6    End of Session   Activity Tolerance: Patient limited by pain Patient left: in bed;with call bell/phone within reach;with bed alarm set;with family/visitor present Nurse Communication: Mobility status PT Visit Diagnosis: History of falling (Z91.81);Pain    Time:  0910-0939 PT Time Calculation (min) (ACUTE ONLY): 29 min   Charges:   PT Evaluation $PT Eval Moderate Complexity: 1 Procedure PT Treatments $Therapeutic Exercise: 8-22 mins   PT G CodesBlanchard Kelch PT [phone]   Rada Hay 05/19/2017, 11:09 AM

## 2017-05-20 DIAGNOSIS — N178 Other acute kidney failure: Secondary | ICD-10-CM

## 2017-05-20 DIAGNOSIS — R32 Unspecified urinary incontinence: Secondary | ICD-10-CM

## 2017-05-20 LAB — URINE CULTURE: Culture: >=100000 — AB

## 2017-05-20 LAB — CBC
HEMATOCRIT: 29 % — AB (ref 39.0–52.0)
Hemoglobin: 9.8 g/dL — ABNORMAL LOW (ref 13.0–17.0)
MCH: 29.4 pg (ref 26.0–34.0)
MCHC: 33.8 g/dL (ref 30.0–36.0)
MCV: 87.1 fL (ref 78.0–100.0)
PLATELETS: 63 10*3/uL — AB (ref 150–400)
RBC: 3.33 MIL/uL — ABNORMAL LOW (ref 4.22–5.81)
RDW: 14 % (ref 11.5–15.5)
WBC: 8.2 10*3/uL (ref 4.0–10.5)

## 2017-05-20 LAB — BASIC METABOLIC PANEL
Anion gap: 8 (ref 5–15)
BUN: 50 mg/dL — AB (ref 6–20)
CALCIUM: 8.6 mg/dL — AB (ref 8.9–10.3)
CO2: 17 mmol/L — ABNORMAL LOW (ref 22–32)
CREATININE: 3.7 mg/dL — AB (ref 0.61–1.24)
Chloride: 112 mmol/L — ABNORMAL HIGH (ref 101–111)
GFR calc Af Amer: 17 mL/min — ABNORMAL LOW (ref >60)
GFR calc non Af Amer: 15 mL/min — ABNORMAL LOW (ref >60)
GLUCOSE: 83 mg/dL (ref 65–99)
Potassium: 3.6 mmol/L (ref 3.5–5.1)
SODIUM: 137 mmol/L (ref 135–145)

## 2017-05-20 LAB — GLUCOSE, CAPILLARY: GLUCOSE-CAPILLARY: 85 mg/dL (ref 65–99)

## 2017-05-20 MED ORDER — CEFPODOXIME PROXETIL 200 MG PO TABS
200.0000 mg | ORAL_TABLET | Freq: Every day | ORAL | Status: AC
  Administered 2017-05-20 – 2017-05-26 (×7): 200 mg via ORAL
  Filled 2017-05-20 (×7): qty 1

## 2017-05-20 NOTE — Progress Notes (Signed)
PROGRESS NOTE    Cristian Gates  ZOX:[id number] DOB: June 08, 1941 DOA: 05/18/2017 PCP: Patient, No Pcp Per    Brief Narrative:  76 y.o. male with medical history significant for hypertension, osteoarthritis who presented to [facility] status post fall at home. Patient has been feeling weak for past week or so prior to the admission. No shortness of breath, palpitations, chest pain. No fevers or chills. No abdominal pain, nausea or vomiting. No recent sick contacts.   ED Course: In ED, pt was hemodynamically stable. BP was 141.97, HR 118, RR 18. Blood work was significant for hemoglobin of 11.4, platelets of 88, CO2 17, creatinine 5.27. No previous Cr value available for comparison. CXR showed no acute cardiopulmonary findings. Right hip x ray did not show acute fractures other than osteoarthritis. No acute findings on CT head. Pt started on IV fluids for acute renal failure. UA showed large leukocytes, too numerous to count WBC and pt was started on IV rocephin. Of note, foley catheter was placed in ED and there was blood in foley catheter likely associated with trauma from foley.   Assessment & Plan:   Principal Problem:   Fall at home, initial encounter Active Problems:   ARF (acute renal failure) (HCC)   Generalized weakness   Essential hypertension   Acute lower UTI   1. Renal failure, unknown if chronic or acute as there is no baseline Cr 1. Unknown baseline renal function 2. Presenting Cr of 5.27 3. Pt is voiding 4. Pt is continued on IVF hydration 5. Cr has improved to 3.7 6. Renal US reviewed. Findings of slightly atrophic kidneys with no hydronephrosis, bilateral cysts 2. HTN 1. BP remains stable at this time 2. Cont on atenolol as tolerated 3. Weakness 1. PT was consulted with recs for SNF 2. SW assisting with placement 4. Thrombocytopenia 1. Unclear etiology 2. Stable. Recheck CBC in AM 5. Metabolic acidosis 1. Likely secondary to presenting renal failure 2. Cont IVF  hydration 3. Improving. 4. Recheck BMET in AM 6. Osteoarthritis 1. Hip xrays reviewed. No fracture, however findings c/w osteoarthritis 2. Avoid NSAIDs given poor renal function 3. PT recs for SNF 7. Sinus tach 1. HR in the low 100's noted 2. Continue on atenolol 3. Continue PRN labetalol IV for HR>120 with hold parameters  4. Improving 8. Hematuria 1. Suspected foley trauma 2. Will continue indwelling foley cath 3. Urine appears more clear  DVT prophylaxis: SCD's Code Status: Full Family Communication: Pt in room, family not at bedside Disposition Plan: SNF, timing uncertain  Consultants:     Procedures:     Antimicrobials: Anti-infectives    Start     Dose/Rate Route Frequency Ordered Stop   05/19/17 1200  cefTRIAXone (ROCEPHIN) 1 g in dextrose 5 % 50 mL IVPB     1 g 100 mL/hr over 30 Minutes Intravenous Every 24 hours 05/18/17 1350     05/18/17 1215  cefTRIAXone (ROCEPHIN) 1 g in dextrose 5 % 50 mL IVPB     1 g 100 mL/hr over 30 Minutes Intravenous  Once 05/18/17 1209 05/18/17 1344      Subjective: Without complaints  Objective: Vitals:   05/19/17 1348 05/20/17 0118 05/20/17 0706 05/20/17 1400  BP: 139/83 133/82 130/69 131/67  Pulse: (!) 112 (!) 106 (!) 103 99  Resp: 16 16 18 14   Temp: 97.7 F (36.5 C) 97.7 F (36.5 C) 97.3 F (36.3 C) 97.7 F (36.5 C)  TempSrc: Oral Oral Oral Oral  SpO2: 100% 100% 100%  100%  Weight:      Height:        Intake/Output Summary (Last 24 hours) at 05/20/17 1703 Last data filed at 05/20/17 1500  Gross per 24 hour  Intake             2045 ml  Output             1400 ml  Net              645 ml   Filed Weights   05/18/17 1700 05/19/17 0701  Weight: 60.3 kg (132 lb 15 oz) 64.6 kg (142 lb 6.7 oz)    Examination: General exam: Awake, laying in bed, in nad Respiratory system: Normal respiratory effort, no wheezing Cardiovascular system: regular rate, s1, s2 Gastrointestinal system: Soft, nondistended, positive  BS Central nervous system: CN2-12 grossly intact, strength intact Extremities: Perfused, no clubbing Skin: Normal skin turgor, no notable skin lesions seen Psychiatry: Mood normal // no visual hallucinations   Data Reviewed: I have personally reviewed following labs and imaging studies  CBC:  Recent Labs Lab 05/18/17 0950 05/18/17 1849 05/19/17 0526 05/20/17 0542  WBC 9.8 7.4 6.9 8.2  NEUTROABS  --  6.1  --   --   HGB 11.4* 10.6* 9.5* 9.8*  HCT 33.9* 31.7* 28.0* 29.0*  MCV 87.8 87.8 86.4 87.1  PLT 88* 75* 66* 63*   Basic Metabolic Panel:  Recent Labs Lab 05/18/17 0950 05/18/17 1849 05/19/17 0526 05/20/17 0542  NA 136  --  136 137  K 4.2  --  3.9 3.6  CL 108  --  111 112*  CO2 17*  --  14* 17*  GLUCOSE 94  --  76 83  BUN 72*  --  61* 50*  CREATININE 5.27*  --  4.34* 3.70*  CALCIUM 9.3  --  8.6* 8.6*  MG  --  2.0  --   --   PHOS  --  3.7  --   --    GFR: Estimated Creatinine Clearance: 16 mL/min (A) (by C-G formula based on SCr of 3.7 mg/dL (H)). Liver Function Tests:  Recent Labs Lab 05/18/17 0950  AST 19  ALT 15*  ALKPHOS 60  BILITOT 1.1  PROT 6.9  ALBUMIN 3.6   No results for input(s): LIPASE, AMYLASE in the last 168 hours. No results for input(s): AMMONIA in the last 168 hours. Coagulation Profile: No results for input(s): INR, PROTIME in the last 168 hours. Cardiac Enzymes: No results for input(s): CKTOTAL, CKMB, CKMBINDEX, TROPONINI in the last 168 hours. BNP (last 3 results) No results for input(s): PROBNP in the last 8760 hours. HbA1C: No results for input(s): HGBA1C in the last 72 hours. CBG:  Recent Labs Lab 05/19/17 0733 05/20/17 0814  GLUCAP 77 85   Lipid Profile: No results for input(s): CHOL, HDL, LDLCALC, TRIG, CHOLHDL, LDLDIRECT in the last 72 hours. Thyroid Function Tests: No results for input(s): TSH, T4TOTAL, FREET4, T3FREE, THYROIDAB in the last 72 hours. Anemia Panel: No results for input(s): VITAMINB12, FOLATE,  FERRITIN, TIBC, IRON, RETICCTPCT in the last 72 hours. Sepsis Labs: No results for input(s): PROCALCITON, LATICACIDVEN in the last 168 hours.  Recent Results (from the past 240 hour(s))  Urine culture     Status: Abnormal   Collection Time: 05/18/17 10:25 AM  Result Value Ref Range Status   Specimen Description URINE, RANDOM  Final   Special Requests NONE  Final   Culture >=100,000 COLONIES/mL ESCHERICHIA COLI (A)  Final  Report Status 05/20/2017 FINAL  Final   Organism ID, Bacteria ESCHERICHIA COLI (A)  Final      Susceptibility   Escherichia coli - MIC*    AMPICILLIN 8 SENSITIVE Sensitive     CEFAZOLIN <=4 SENSITIVE Sensitive     CEFTRIAXONE <=1 SENSITIVE Sensitive     CIPROFLOXACIN <=0.25 SENSITIVE Sensitive     GENTAMICIN <=1 SENSITIVE Sensitive     IMIPENEM <=0.25 SENSITIVE Sensitive     NITROFURANTOIN 32 SENSITIVE Sensitive     TRIMETH/SULFA <=20 SENSITIVE Sensitive     AMPICILLIN/SULBACTAM 4 SENSITIVE Sensitive     PIP/TAZO <=4 SENSITIVE Sensitive     Extended ESBL NEGATIVE Sensitive     * >=100,000 COLONIES/mL ESCHERICHIA COLI     Radiology Studies: US Renal  Result Date: 05/18/2017 CLINICAL DATA:  Hematuria EXAM: RENAL / URINARY TRACT ULTRASOUND COMPLETE COMPARISON:  CT 04/01/2008 FINDINGS: Right Kidney: Length: 8.9 cm. Slightly atrophic. Two cysts in the midpole with the larger measuring 3.5 cm in maximum diameter. There is no hydronephrosis Left Kidney: Length: 8.6 cm. Slightly atrophic. Cysts in the mid to lower pole measuring 1.5 cm. Bladder: Foley catheter is present in the bladder. There appears to be echogenic material within the posterior bladder. IMPRESSION: 1. Slightly atrophic kidneys. No hydronephrosis. Bilateral renal cysts. 2. Suspect echogenic material in the posterior bladder which may represent debris or possible thrombus. Electronically Signed   By: Jasmine Pang M.D.   On: 05/18/2017 19:58    Scheduled Meds: . amLODipine  10 mg Oral Daily  .  chlorhexidine  15 mL Mouth Rinse BID  . mouth rinse  15 mL Mouth Rinse q12n4p  . sodium chloride flush  3 mL Intravenous Q12H   Continuous Infusions: . sodium chloride 75 mL/hr at 05/20/17 0742  . cefTRIAXone (ROCEPHIN)  IV Stopped (05/20/17 1254)     LOS: 2 days   CHIU, Scheryl Marten, MD [facility] Pager [phone]  If 7PM-7AM, please contact night-coverage [email or web address] Password [email or web address] 05/20/2017, 5:03 PM

## 2017-05-20 NOTE — Progress Notes (Signed)
CSW met with patient and family via bedside regarding SNF placement. Family requested CSW to fax information to [facility]. Family would like until tomorrow 6/10 to make decision. CSW will continue to follow up.   Kingsley Spittle, [place] Clinical Social Worker [phone]

## 2017-05-21 LAB — BASIC METABOLIC PANEL
ANION GAP: 8 (ref 5–15)
BUN: 44 mg/dL — ABNORMAL HIGH (ref 6–20)
CALCIUM: 8.6 mg/dL — AB (ref 8.9–10.3)
CO2: 18 mmol/L — ABNORMAL LOW (ref 22–32)
Chloride: 113 mmol/L — ABNORMAL HIGH (ref 101–111)
Creatinine, Ser: 3.01 mg/dL — ABNORMAL HIGH (ref 0.61–1.24)
GFR, EST AFRICAN AMERICAN: 22 mL/min — AB (ref >60)
GFR, EST NON AFRICAN AMERICAN: 19 mL/min — AB (ref >60)
Glucose, Bld: 87 mg/dL (ref 65–99)
POTASSIUM: 3.6 mmol/L (ref 3.5–5.1)
Sodium: 139 mmol/L (ref 135–145)

## 2017-05-21 LAB — GLUCOSE, CAPILLARY: Glucose-Capillary: 89 mg/dL (ref 65–99)

## 2017-05-21 MED ORDER — GERHARDT'S BUTT CREAM
TOPICAL_CREAM | Freq: Three times a day (TID) | CUTANEOUS | Status: AC
  Administered 2017-05-21: 1 via TOPICAL
  Administered 2017-05-21 – 2017-05-22 (×3): via TOPICAL
  Administered 2017-05-23: 1 via TOPICAL
  Administered 2017-05-23 – 2017-05-24 (×4): via TOPICAL
  Filled 2017-05-21: qty 1

## 2017-05-21 NOTE — Consult Note (Signed)
WOC Nurse wound consult note Reason for Consult: Denuded tissue in the scrotal area, also noted are denuded medial thighs.  Patient with blood in the indwelling urinary catheter.  Patient noted to not change position independently. He is at high risk for skin breakdown with a Braden total score of 11. Wound type: Moisture associated skin damage, specifically incontinence associated dermatitis (IAD) Pressure Injury POA: No Measurement: Scattered areas of denudation, contact dermatitis in the areas of a brief: buttocks, perineal area, scrotum and medial thighs. Wound bed: Pink, moist with no exudate.  Dry peeling skin at periphery may be an indication of resolving fungal overgrowth. Drainage (amount, consistency, odor) See above Periwound:See above Dressing procedure/placement/frequency: I have provided nursing with guidance via the orders for positioning and HOB elevation, I will provide patient with bilateral Heel pressure redistribution boots that he can take with him post discharge.  I have provided a chair cushion for his use when OOB in the chair.  For the topical care of the denuded areas I have implemented three-times daily application of Dr. Dennie MaizesGerhardt's butt cream after cleansing affected areas; a 1:1;1 compounded mixture of Zinc oxide, antifungal and hydrocortisone cream.  I would look for resolution of the dermatitis within 72-96 hours. WOC nursing team will not follow, but will remain available to this patient, the nursing and medical teams.  Please re-consult if needed. Thanks, Ladona MowLaurie Mykayla Brinton, MSN, RN, GNP, Hans EdenCWOCN, CWON-AP, FAAN  Pager# [phone]

## 2017-05-21 NOTE — Social Work (Addendum)
CSW met with patient today and family selected bed at [facility].  CSW with admissions on availability for DC this week.

## 2017-05-21 NOTE — Progress Notes (Signed)
CTM reports that pt continues to have freq PVC's and at time will be trigeminy for less than a minute then returns to ST 112.   No change in pt. VSS   Will continue to monitor.

## 2017-05-21 NOTE — Progress Notes (Signed)
PROGRESS NOTE    Cristian Gates  ZOX:[id number] DOB: 1941-05-12 DOA: 05/18/2017 PCP: Patient, No Pcp Per    Brief Narrative:  75 y.o. male with medical history significant for hypertension, osteoarthritis who presented to [facility] status post fall at home. Patient has been feeling weak for past week or so prior to the admission. No shortness of breath, palpitations, chest pain. No fevers or chills. No abdominal pain, nausea or vomiting. No recent sick contacts.   ED Course: In ED, pt was hemodynamically stable. BP was 141.97, HR 118, RR 18. Blood work was significant for hemoglobin of 11.4, platelets of 88, CO2 17, creatinine 5.27. No previous Cr value available for comparison. CXR showed no acute cardiopulmonary findings. Right hip x ray did not show acute fractures other than osteoarthritis. No acute findings on CT head. Pt started on IV fluids for acute renal failure. UA showed large leukocytes, too numerous to count WBC and pt was started on IV rocephin. Of note, foley catheter was placed in ED and there was blood in foley catheter likely associated with trauma from foley.   Assessment & Plan:   Principal Problem:   Fall at home, initial encounter Active Problems:   ARF (acute renal failure) (HCC)   Generalized weakness   Essential hypertension   Acute lower UTI   1. Renal failure, unknown if chronic or acute as there is no baseline Cr 1. Unknown baseline renal function 2. On further discussion with family, family reports the patient has very limited by mouth intake prior to hospital admission with concerns by family members for dehydration. 3. Presenting Cr of 5.27 4. Pt is voiding 5. Pt is tolerating IV fluid hydration 6. Renal US reviewed. Findings of slightly atrophic kidneys with no hydronephrosis, bilateral cysts 7. Labs reviewed with family. Patient with creatinine of 3.0 8. Repeat basic metabolic panel in the morning 2. HTN 1. BP remains stable at this time 2. We'll continue  with atenolol as tolerated 3. Weakness 1. PT was consulted with recs for SNF 2. SW assisting with placement 3. Overall stable at present. Patient is agreeable to go to skilled nursing 4. Thrombocytopenia 1. Unclear etiology 2. Platelets have remained low 5. Metabolic acidosis 1. Likely secondary to presenting renal failure 2. Cont IVF hydration 3. Continuing to improve. 6. Osteoarthritis 1. Hip xrays reviewed. No fracture, however findings c/w osteoarthritis 2. Avoid NSAIDs given poor renal function 3. Plan for physical therapy as tolerated 7. Sinus tach 1. Patient still with HR in the low 100's noted 2. Continue on atenolol 3. Continue PRN labetalol IV for HR>120 with hold parameters  4. Overall improved since admission 8. Hematuria 1. Suspected foley trauma 2. Will continue indwelling foley cath 3. Urine appears clear and yellow now much hematuria resolving  DVT prophylaxis: SCD's Code Status: Full Family Communication: Pt in room, patient's son at bedside Disposition Plan: SNF, timing uncertain  Consultants:     Procedures:     Antimicrobials: Anti-infectives    Start     Dose/Rate Route Frequency Ordered Stop   05/20/17 1830  cefpodoxime (VANTIN) tablet 200 mg     200 mg Oral Daily 05/20/17 1724     05/19/17 1200  cefTRIAXone (ROCEPHIN) 1 g in dextrose 5 % 50 mL IVPB  Status:  Discontinued     1 g 100 mL/hr over 30 Minutes Intravenous Every 24 hours 05/18/17 1350 05/20/17 1724   05/18/17 1215  cefTRIAXone (ROCEPHIN) 1 g in dextrose 5 % 50 mL IVPB  1 g 100 mL/hr over 30 Minutes Intravenous  Once 05/18/17 1209 05/18/17 1344      Subjective: Patient with no complaints  Objective: Vitals:   05/20/17 0706 05/20/17 1400 05/20/17 2209 05/21/17 0537  BP: 130/69 131/67 133/73 127/70  Pulse: (!) 103 99 (!) 101 (!) 105  Resp: 18 14 16 16   Temp: 97.3 F (36.3 C) 97.7 F (36.5 C) 97.8 F (36.6 C) 97.5 F (36.4 C)  TempSrc: Oral Oral Oral Oral  SpO2: 100%  100% 100% 100%  Weight:    62.3 kg (137 lb 5.6 oz)  Height:        Intake/Output Summary (Last 24 hours) at 05/21/17 1518 Last data filed at 05/21/17 0827  Gross per 24 hour  Intake             1590 ml  Output             1400 ml  Net              190 ml   Filed Weights   05/18/17 1700 05/19/17 0701 05/21/17 0537  Weight: 60.3 kg (132 lb 15 oz) 64.6 kg (142 lb 6.7 oz) 62.3 kg (137 lb 5.6 oz)    Examination: General exam: Conversant, in no acute distress Respiratory system: normal chest rise, clear, no audible wheezing Cardiovascular system: regular rhythm, s1-s2 Gastrointestinal system: Nondistended, nontender, pos BS Central nervous system: No seizures, no tremors Extremities: No cyanosis, no joint deformities Skin: No rashes, no pallor Psychiatry: Affect normal // no auditory hallucinations    Data Reviewed: I have personally reviewed following labs and imaging studies  CBC:  Recent Labs Lab 05/18/17 0950 05/18/17 1849 05/19/17 0526 05/20/17 0542  WBC 9.8 7.4 6.9 8.2  NEUTROABS  --  6.1  --   --   HGB 11.4* 10.6* 9.5* 9.8*  HCT 33.9* 31.7* 28.0* 29.0*  MCV 87.8 87.8 86.4 87.1  PLT 88* 75* 66* 63*   Basic Metabolic Panel:  Recent Labs Lab 05/18/17 0950 05/18/17 1849 05/19/17 0526 05/20/17 0542 05/21/17 0513  NA 136  --  136 137 139  K 4.2  --  3.9 3.6 3.6  CL 108  --  111 112* 113*  CO2 17*  --  14* 17* 18*  GLUCOSE 94  --  76 83 87  BUN 72*  --  61* 50* 44*  CREATININE 5.27*  --  4.34* 3.70* 3.01*  CALCIUM 9.3  --  8.6* 8.6* 8.6*  MG  --  2.0  --   --   --   PHOS  --  3.7  --   --   --    GFR: Estimated Creatinine Clearance: 19 mL/min (A) (by C-G formula based on SCr of 3.01 mg/dL (H)). Liver Function Tests:  Recent Labs Lab 05/18/17 0950  AST 19  ALT 15*  ALKPHOS 60  BILITOT 1.1  PROT 6.9  ALBUMIN 3.6   No results for input(s): LIPASE, AMYLASE in the last 168 hours. No results for input(s): AMMONIA in the last 168 hours. Coagulation  Profile: No results for input(s): INR, PROTIME in the last 168 hours. Cardiac Enzymes: No results for input(s): CKTOTAL, CKMB, CKMBINDEX, TROPONINI in the last 168 hours. BNP (last 3 results) No results for input(s): PROBNP in the last 8760 hours. HbA1C: No results for input(s): HGBA1C in the last 72 hours. CBG:  Recent Labs Lab 05/19/17 0733 05/20/17 0814 05/21/17 0800  GLUCAP 77 85 89   Lipid Profile: No  results for input(s): CHOL, HDL, LDLCALC, TRIG, CHOLHDL, LDLDIRECT in the last 72 hours. Thyroid Function Tests: No results for input(s): TSH, T4TOTAL, FREET4, T3FREE, THYROIDAB in the last 72 hours. Anemia Panel: No results for input(s): VITAMINB12, FOLATE, FERRITIN, TIBC, IRON, RETICCTPCT in the last 72 hours. Sepsis Labs: No results for input(s): PROCALCITON, LATICACIDVEN in the last 168 hours.  Recent Results (from the past 240 hour(s))  Urine culture     Status: Abnormal   Collection Time: 05/18/17 10:25 AM  Result Value Ref Range Status   Specimen Description URINE, RANDOM  Final   Special Requests NONE  Final   Culture >=100,000 COLONIES/mL ESCHERICHIA COLI (A)  Final   Report Status 05/20/2017 FINAL  Final   Organism ID, Bacteria ESCHERICHIA COLI (A)  Final      Susceptibility   Escherichia coli - MIC*    AMPICILLIN 8 SENSITIVE Sensitive     CEFAZOLIN <=4 SENSITIVE Sensitive     CEFTRIAXONE <=1 SENSITIVE Sensitive     CIPROFLOXACIN <=0.25 SENSITIVE Sensitive     GENTAMICIN <=1 SENSITIVE Sensitive     IMIPENEM <=0.25 SENSITIVE Sensitive     NITROFURANTOIN 32 SENSITIVE Sensitive     TRIMETH/SULFA <=20 SENSITIVE Sensitive     AMPICILLIN/SULBACTAM 4 SENSITIVE Sensitive     PIP/TAZO <=4 SENSITIVE Sensitive     Extended ESBL NEGATIVE Sensitive     * >=100,000 COLONIES/mL ESCHERICHIA COLI     Radiology Studies: No results found.  Scheduled Meds: . amLODipine  10 mg Oral Daily  . cefpodoxime  200 mg Oral Daily  . chlorhexidine  15 mL Mouth Rinse BID  .  Gerhardt's butt cream   Topical TID  . mouth rinse  15 mL Mouth Rinse q12n4p  . sodium chloride flush  3 mL Intravenous Q12H   Continuous Infusions: . sodium chloride 75 mL/hr at 05/20/17 0742     LOS: 3 days   Courtnay Petrilla, Scheryl Marten, MD [facility] Pager [phone]  If 7PM-7AM, please contact night-coverage [email or web address] Password [email or web address] 05/21/2017, 3:18 PM

## 2017-05-21 NOTE — Progress Notes (Signed)
Continues to have bloody urine with clots.  Day rn reports leaking and has leaking currently.  Have contacted the Dr. Antionette Charpyd to get irrigation order.

## 2017-05-22 ENCOUNTER — Inpatient Hospital Stay (HOSPITAL_COMMUNITY): Payer: Medicare Other

## 2017-05-22 DIAGNOSIS — D696 Thrombocytopenia, unspecified: Secondary | ICD-10-CM

## 2017-05-22 DIAGNOSIS — N3091 Cystitis, unspecified with hematuria: Secondary | ICD-10-CM

## 2017-05-22 DIAGNOSIS — N179 Acute kidney failure, unspecified: Secondary | ICD-10-CM

## 2017-05-22 DIAGNOSIS — L8990 Pressure ulcer of unspecified site, unspecified stage: Secondary | ICD-10-CM | POA: Insufficient documentation

## 2017-05-22 DIAGNOSIS — R32 Unspecified urinary incontinence: Secondary | ICD-10-CM

## 2017-05-22 LAB — BASIC METABOLIC PANEL
ANION GAP: 6 (ref 5–15)
BUN: 35 mg/dL — ABNORMAL HIGH (ref 6–20)
CHLORIDE: 113 mmol/L — AB (ref 101–111)
CO2: 19 mmol/L — ABNORMAL LOW (ref 22–32)
Calcium: 8.4 mg/dL — ABNORMAL LOW (ref 8.9–10.3)
Creatinine, Ser: 2.73 mg/dL — ABNORMAL HIGH (ref 0.61–1.24)
GFR, EST AFRICAN AMERICAN: 25 mL/min — AB (ref >60)
GFR, EST NON AFRICAN AMERICAN: 21 mL/min — AB (ref >60)
Glucose, Bld: 104 mg/dL — ABNORMAL HIGH (ref 65–99)
POTASSIUM: 3.6 mmol/L (ref 3.5–5.1)
SODIUM: 138 mmol/L (ref 135–145)

## 2017-05-22 LAB — CBC
HEMATOCRIT: 26.3 % — AB (ref 39.0–52.0)
Hemoglobin: 9.1 g/dL — ABNORMAL LOW (ref 13.0–17.0)
MCH: 29.8 pg (ref 26.0–34.0)
MCHC: 34.6 g/dL (ref 30.0–36.0)
MCV: 86.2 fL (ref 78.0–100.0)
Platelets: 32 10*3/uL — ABNORMAL LOW (ref 150–400)
RBC: 3.05 MIL/uL — AB (ref 4.22–5.81)
RDW: 14.1 % (ref 11.5–15.5)
WBC: 10.9 10*3/uL — ABNORMAL HIGH (ref 4.0–10.5)

## 2017-05-22 LAB — VITAMIN B12: Vitamin B-12: 229 pg/mL (ref 180–914)

## 2017-05-22 LAB — PROTIME-INR
INR: 1.2
PROTHROMBIN TIME: 15.2 s (ref 11.4–15.2)

## 2017-05-22 LAB — APTT: aPTT: 36 seconds (ref 24–36)

## 2017-05-22 LAB — FIBRINOGEN: Fibrinogen: 448 mg/dL (ref 210–475)

## 2017-05-22 LAB — GLUCOSE, CAPILLARY: Glucose-Capillary: 101 mg/dL — ABNORMAL HIGH (ref 65–99)

## 2017-05-22 MED ORDER — SODIUM CHLORIDE 0.9 % IV SOLN
Freq: Once | INTRAVENOUS | Status: AC
  Administered 2017-05-22: 17:00:00 via INTRAVENOUS

## 2017-05-22 NOTE — Consult Note (Signed)
I have been asked to see the patient by Dr. Onalee Huaavid Tat, for evaluation and management of gross hematuria.  History of present illness: 76 year old African-American Male with little previous medical history who presented to the emergency department after falling. He has been declining since approximately March with associated generalized weakness and increasing urinary incontinence. A extensive evaluation revealed that the patient had a urinary tract infection. He had associated acute renal failure, thrombocytopenia, and gross hematuria. A Foley catheter was placed in the emergency department.  A renal ultrasound demonstrated atrophic kidneys, but no hydronephrosis. His bladder was not completely visualized.  Initially, the hematuria was thought to be associated with catheter trauma, however urinalysis then did reveal suspicion of a urinary tract infection. Ultimately grew a pansensitive Escherichia coli in his urine culture. The patient has been treated with antibiotics since that time and his urine was noted to be clearing. Yesterday evening the patient's hematuria became thicker again, and his Foley catheter started clotting. The Foley catheter was subsequently removed.  I was consult and for gross hematuria.  Review of systems: A 12 point comprehensive review of systems was obtained and is negative unless otherwise stated in the history of present illness.  Patient Active Problem List   Diagnosis Date Noted  . Pressure injury of skin 05/22/2017  . Hemorrhagic cystitis 05/22/2017  . Thrombocytopenia (HCC) 05/22/2017  . AKI (acute kidney injury) (HCC) 05/22/2017  . Urinary incontinence   . ARF (acute renal failure) (HCC) 05/18/2017  . Generalized weakness 05/18/2017  . Fall at home, initial encounter 05/18/2017  . Essential hypertension 05/18/2017  . Acute lower UTI 05/18/2017    No current facility-administered medications on file prior to encounter.    No current outpatient prescriptions  on file prior to encounter.    Past Medical History:  Diagnosis Date  . Hypertension     History reviewed. No pertinent surgical history.  Social History  Substance Use Topics  . Smoking status: Former Smoker    Types: Cigarettes  . Smokeless tobacco: Never Used  . Alcohol use No    History reviewed. No pertinent family history.  PE: Vitals:   05/22/17 0433 05/22/17 1320 05/22/17 1839 05/22/17 1905  BP: 138/86 139/82 137/65 126/86  Pulse: (!) 109 (!) 131 (!) 118 (!) 122  Resp: 18 20 20 20   Temp: 98.1 F (36.7 C) 97.5 F (36.4 C) 98.9 F (37.2 C) 98.9 F (37.2 C)  TempSrc: Oral Oral Oral Oral  SpO2: 100% 100% 100% 100%  Weight: 62.6 kg (138 lb 0.1 oz)     Height:       Patient appears to be in no acute distress  patient is alert and oriented x3 Atraumatic normocephalic head No cervical or supraclavicular lymphadenopathy appreciated No increased work of breathing, no audible wheezes/rhonchi Regular sinus rhythm/rate Abdomen is soft, nontender, nondistended, no CVA or suprapubic tenderness Lower extremities are symmetric without appreciable edema Grossly neurologically intact No identifiable skin lesions   Recent Labs  05/20/17 0542 05/22/17 0518  WBC 8.2 10.9*  HGB 9.8* 9.1*  HCT 29.0* 26.3*    Recent Labs  05/20/17 0542 05/21/17 0513 05/22/17 0518  NA 137 139 138  K 3.6 3.6 3.6  CL 112* 113* 113*  CO2 17* 18* 19*  GLUCOSE 83 87 104*  BUN 50* 44* 35*  CREATININE 3.70* 3.01* 2.73*  CALCIUM 8.6* 8.6* 8.4*    Recent Labs  05/22/17 1518  INR 1.20   No results for input(s): LABURIN in the last  72 hours. Results for orders placed or performed during the hospital encounter of 05/18/17  Urine culture     Status: Abnormal   Collection Time: 05/18/17 10:25 AM  Result Value Ref Range Status   Specimen Description URINE, RANDOM  Final   Special Requests NONE  Final   Culture >=100,000 COLONIES/mL ESCHERICHIA COLI (A)  Final   Report Status  05/20/2017 FINAL  Final   Organism ID, Bacteria ESCHERICHIA COLI (A)  Final      Susceptibility   Escherichia coli - MIC*    AMPICILLIN 8 SENSITIVE Sensitive     CEFAZOLIN <=4 SENSITIVE Sensitive     CEFTRIAXONE <=1 SENSITIVE Sensitive     CIPROFLOXACIN <=0.25 SENSITIVE Sensitive     GENTAMICIN <=1 SENSITIVE Sensitive     IMIPENEM <=0.25 SENSITIVE Sensitive     NITROFURANTOIN 32 SENSITIVE Sensitive     TRIMETH/SULFA <=20 SENSITIVE Sensitive     AMPICILLIN/SULBACTAM 4 SENSITIVE Sensitive     PIP/TAZO <=4 SENSITIVE Sensitive     Extended ESBL NEGATIVE Sensitive     * >=100,000 COLONIES/mL ESCHERICHIA COLI    A three-way Foley catheter had been placed by the nursing staff. The patient was on CBI and his urine was pink. I flushed the patient's Foley catheter and did get some small clots. His urine was pink following the irrigation on a moderate CBI drip.  Imaging: I have independently reviewed the patient's renal ultrasound demonstrating slight atrophic kidneys bilaterally with no evidence of hydronephrosis.  Imp: The patient has a Escherichia coli urinary tract infection which is likely contributing significantly to the patient's hematuria. In addition, he is quite ill passing given his thrombocytopenia which is also likely playing a role in his ongoing or persistent hematuria. However, the patient does need to be completely evaluated with cystoscopy at some point. This is especially true in light of the fact that he has not had any significant medical care over the past 10 years and has had progressive systemic failure.  Recommendations: I recommend to wean the patient CBI to off, titrate the reflux to a pink color. I would recommend that the patient keep his Foley catheter until his urine clears and then be given a voiding trial. If he is unable to pass a voiding trial, I would recommend replacing the Foley catheter and having him follow up with urology for repeat voiding trial and  cystoscopy.  Thank you for involving me in this patient's care, I will continue to follow along.   Marland KitchenBerniece Salines W

## 2017-05-22 NOTE — Progress Notes (Signed)
PROGRESS NOTE  Cristian Gates:[id number] DOB: 1941/09/12 DOA: 05/18/2017 PCP: Patient, No Pcp Per  Brief History:  76 year old male with a history of hypertension and no other document a medical problems presented after a mechanical fall at home. Further history obtained through the family revealed that he has had increasing generalized weakness since March 2018. He has had more difficulty walking exercise generalized weakness. The family has also noted increasing urinary incontinence resulting in skin breakdown. The patient was found to have acute kidney injury at the time of admission with a serum creatinine of 5.27. Because the patient has been very guarded in terms of his medical history, his family was unable to clarify when the last time he saw a physician. Nevertheless, a Foley catheter was inserted at the time of admission secondary to his renal failure. The patient developed hematuria which was initially felt to be due to catheter trauma. Urinalysis showed TNTC WBC and urine culture grew pansensitive Escherichia coli. The patient was placed on ceftriaxone initially, and this was changed to oral cefpodoxime on 05/21/2017. His urine ultimately cleared; however on 05/21/2017 evening, the patient developed hematuria again. There was difficulty flushing the patient's Foley catheter. Ultimately, his Foley catheter was discontinued. Urology was consulted to assist with management.  Assessment/Plan: AKI  -unclear renal baseline -Suspect patient has a degree of underlying CKD  -05/18/2017 renal ultrasound--slightly atrophic kidneys without hydronephrosis; echogenic bladder debris  -Improving with IV fluids  -Presenting serum creatinine 5.27 >>>> 2.73  -Discontinued chlorthalidone   Hemorrhagic cystitis -Initially felt hematuria may been due to Foley catheter trauma, but now patient has suprapubic tenderness and recurrent hematuria -Continue cefpodoxime -Consult urology--discussed with  Dr. Janora Norlander 3-way foley and start CBI  -check coags  Thrombocytopenia -Continue gradual drop likely partly due to dilution -Transfuse 1 unit platelets as pt now has recurrent hematuria -Serum B12 -HIV -hep b surface antigen -hep C antibody -check coags -fibrinogen  Sinus tachycardia -Likely due to acute medical illness -Continue IV fluids -Personally reviewed EKG--sinus tachycardia, nonspecific T wave change -TSH   Essential hypertension -Continue amlodipine -Blood pressure controlled   generalized weakness -PT evaluation-->SNF -Serum B12 -TSH  Pressure injury to the skin -present at time of admission -local wound care    Disposition Plan:   SNF in 1-2 days Family Communication:   No Family at bedside  Consultants:  urology  Code Status:  FULL   DVT Prophylaxis:  SCDs   Procedures: As Listed in Progress Note Above  Antibiotics: Ceftriaxone 6/7>>>6/9 cefpodoxime 6/10>>>    Subjective:  patient complains of recurrent hematuria and suprapubic discomfort. Denies any frank dysuria. Denies any headache, chest pain, shortness of breath, nausea, vomiting, other abdominal pain. Denies any medication or melena.   Objective: Vitals:   05/21/17 1500 05/21/17 2119 05/22/17 0433 05/22/17 1320  BP: 124/72 125/63 138/86 139/82  Pulse: (!) 113 (!) 114 (!) 109 (!) 131  Resp: 18 20 18 20   Temp: 97.4 F (36.3 C) 98 F (36.7 C) 98.1 F (36.7 C) 97.5 F (36.4 C)  TempSrc: Oral Oral Oral Oral  SpO2: 100% 100% 100% 100%  Weight:   62.6 kg (138 lb 0.1 oz)   Height:        Intake/Output Summary (Last 24 hours) at 05/22/17 1359 Last data filed at 05/22/17 1030  Gross per 24 hour  Intake             2390 ml  Output  2650 ml  Net             -260 ml   Weight change: 0.3 kg (10.6 oz) Exam:   General:  Pt is alert, follows commands appropriately, not in acute distress  HEENT: No icterus, No thrush, No neck mass, [employer]/AT  Cardiovascular: RRR,  S1/S2, no rubs, no gallops  Respiratory: CTA bilaterally, no wheezing, no crackles, no rhonchi  Abdomen: Soft/+BS, suprapubic tender, non distended, no guarding  Extremities: No edema, No lymphangitis, No petechiae, No rashes, no synovitis   Data Reviewed: I have personally reviewed following labs and imaging studies Basic Metabolic Panel:  Recent Labs Lab 05/18/17 0950 05/18/17 1849 05/19/17 0526 05/20/17 0542 05/21/17 0513 05/22/17 0518  NA 136  --  136 137 139 138  K 4.2  --  3.9 3.6 3.6 3.6  CL 108  --  111 112* 113* 113*  CO2 17*  --  14* 17* 18* 19*  GLUCOSE 94  --  76 83 87 104*  BUN 72*  --  61* 50* 44* 35*  CREATININE 5.27*  --  4.34* 3.70* 3.01* 2.73*  CALCIUM 9.3  --  8.6* 8.6* 8.6* 8.4*  MG  --  2.0  --   --   --   --   PHOS  --  3.7  --   --   --   --    Liver Function Tests:  Recent Labs Lab 05/18/17 0950  AST 19  ALT 15*  ALKPHOS 60  BILITOT 1.1  PROT 6.9  ALBUMIN 3.6   No results for input(s): LIPASE, AMYLASE in the last 168 hours. No results for input(s): AMMONIA in the last 168 hours. Coagulation Profile: No results for input(s): INR, PROTIME in the last 168 hours. CBC:  Recent Labs Lab 05/18/17 0950 05/18/17 1849 05/19/17 0526 05/20/17 0542 05/22/17 0518  WBC 9.8 7.4 6.9 8.2 10.9*  NEUTROABS  --  6.1  --   --   --   HGB 11.4* 10.6* 9.5* 9.8* 9.1*  HCT 33.9* 31.7* 28.0* 29.0* 26.3*  MCV 87.8 87.8 86.4 87.1 86.2  PLT 88* 75* 66* 63* 32*   Cardiac Enzymes: No results for input(s): CKTOTAL, CKMB, CKMBINDEX, TROPONINI in the last 168 hours. BNP: Invalid input(s): POCBNP CBG:  Recent Labs Lab 05/19/17 0733 05/20/17 0814 05/21/17 0800 05/22/17 0822  GLUCAP 77 85 89 101*   HbA1C: No results for input(s): HGBA1C in the last 72 hours. Urine analysis:    Component Value Date/Time   COLORURINE YELLOW 05/18/2017 1025   APPEARANCEUR TURBID (A) 05/18/2017 1025   LABSPEC 1.012 05/18/2017 1025   PHURINE 5.0 05/18/2017 1025    GLUCOSEU NEGATIVE 05/18/2017 1025   HGBUR LARGE (A) 05/18/2017 1025   BILIRUBINUR NEGATIVE 05/18/2017 1025   KETONESUR NEGATIVE 05/18/2017 1025   PROTEINUR 100 (A) 05/18/2017 1025   NITRITE NEGATIVE 05/18/2017 1025   LEUKOCYTESUR LARGE (A) 05/18/2017 1025   Sepsis Labs: @LABRCNTIP (procalcitonin:4,lacticidven:4) ) Recent Results (from the past 240 hour(s))  Urine culture     Status: Abnormal   Collection Time: 05/18/17 10:25 AM  Result Value Ref Range Status   Specimen Description URINE, RANDOM  Final   Special Requests NONE  Final   Culture >=100,000 COLONIES/mL ESCHERICHIA COLI (A)  Final   Report Status 05/20/2017 FINAL  Final   Organism ID, Bacteria ESCHERICHIA COLI (A)  Final      Susceptibility   Escherichia coli - MIC*    AMPICILLIN 8 SENSITIVE Sensitive  CEFAZOLIN <=4 SENSITIVE Sensitive     CEFTRIAXONE <=1 SENSITIVE Sensitive     CIPROFLOXACIN <=0.25 SENSITIVE Sensitive     GENTAMICIN <=1 SENSITIVE Sensitive     IMIPENEM <=0.25 SENSITIVE Sensitive     NITROFURANTOIN 32 SENSITIVE Sensitive     TRIMETH/SULFA <=20 SENSITIVE Sensitive     AMPICILLIN/SULBACTAM 4 SENSITIVE Sensitive     PIP/TAZO <=4 SENSITIVE Sensitive     Extended ESBL NEGATIVE Sensitive     * >=100,000 COLONIES/mL ESCHERICHIA COLI     Scheduled Meds: . amLODipine  10 mg Oral Daily  . cefpodoxime  200 mg Oral Daily  . chlorhexidine  15 mL Mouth Rinse BID  . Gerhardt's butt cream   Topical TID  . mouth rinse  15 mL Mouth Rinse q12n4p  . sodium chloride flush  3 mL Intravenous Q12H   Continuous Infusions: . sodium chloride 75 mL/hr (05/22/17 0023)    Procedures/Studies: Dg Chest 2 View  Result Date: 05/18/2017 CLINICAL DATA:  Witnessed fall at home. Generalize weakness. History of hypertension. EXAM: CHEST  2 VIEW COMPARISON:  None in PACs FINDINGS: The lungs are well-expanded and clear. The heart and pulmonary vascularity are normal. There is tortuosity of ascending and descending thoracic  aorta. There is mural calcification in the aorta. The mediastinum is normal in width. There is no pleural effusion. The observed bony thorax exhibits no acute abnormality. IMPRESSION: There is no acute cardiopulmonary abnormality. Thoracic aortic atherosclerosis. Electronically Signed   By: Keny Donald  [place] M.D.   On: 05/18/2017 11:27   Ct Head Wo Contrast  Result Date: 05/18/2017 CLINICAL DATA:  Frequent falls.  Generalized weakness. EXAM: CT HEAD WITHOUT CONTRAST TECHNIQUE: Contiguous axial images were obtained from the base of the skull through the vertex without intravenous contrast. COMPARISON:  None. FINDINGS: Brain: No evidence of acute infarction, hemorrhage, hydrocephalus, extra-axial collection or mass lesion/mass effect. Mild-to-moderate atrophy and mild chronic microvascular ischemic change in the cerebral white matter. Vascular: Atherosclerotic calcification.  No hyperdense vessel. Skull: Negative Sinuses/Orbits: Chronic appearing right ethmoid sinusitis. IMPRESSION: 1. No acute finding 2. Atrophy and mild chronic microvascular ischemic change. Electronically Signed   By: Marnee Spring M.D.   On: 05/18/2017 11:16   US Renal  Result Date: 05/18/2017 CLINICAL DATA:  Hematuria EXAM: RENAL / URINARY TRACT ULTRASOUND COMPLETE COMPARISON:  CT 04/01/2008 FINDINGS: Right Kidney: Length: 8.9 cm. Slightly atrophic. Two cysts in the midpole with the larger measuring 3.5 cm in maximum diameter. There is no hydronephrosis Left Kidney: Length: 8.6 cm. Slightly atrophic. Cysts in the mid to lower pole measuring 1.5 cm. Bladder: Foley catheter is present in the bladder. There appears to be echogenic material within the posterior bladder. IMPRESSION: 1. Slightly atrophic kidneys. No hydronephrosis. Bilateral renal cysts. 2. Suspect echogenic material in the posterior bladder which may represent debris or possible thrombus. Electronically Signed   By: Jasmine Pang M.D.   On: 05/18/2017 19:58   Dg Hip Unilat W Or  Wo Pelvis 2-3 Views Right  Result Date: 05/18/2017 CLINICAL DATA:  Right hip pain following a witnessed fall at home EXAM: DG HIP (WITH OR WITHOUT PELVIS) 2-3V RIGHT COMPARISON:  None in PACs FINDINGS: The patient is rotated on this study. The bones are subjectively adequately mineralized. There is mild symmetric narrowing of both hip joint spaces. There is no acute fracture or dislocation of the right hip. The femoral head, neck, intertrochanteric, and immediate sub trochanteric regions appear normal. IMPRESSION: There is no acute bony abnormality of the right  hip. 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 Electronically Signed   By: Kyisha Fowle  [place] M.D.   On: 05/18/2017 11:29    Alishba Naples, DO  [facility] Pager [phone]  If 7PM-7AM, please contact night-coverage [email or web address] Password [email or web address] 05/22/2017, 1:59 PM   LOS: 4 days

## 2017-05-22 NOTE — Progress Notes (Signed)
Dr Tat in to see pt and family.   

## 2017-05-22 NOTE — Progress Notes (Signed)
Physical Therapy Treatment Patient Details Name: Deirdre Peerlbert Fleury MRN: [id number] DOB: 1941-07-08 Today's Date: 05/22/2017    History of Present Illness Deirdre Peerlbert Rineer is a 76 y.o. male with medical history significant for hypertension, osteoarthritis who [facility]. Patient has been feeling weak . negative for fracture of right hip. Blood in  foley catheter.    PT Comments    Pt reporting need to urinate and have BM throughout session.  Only able to roll for pericare.  [facility]  MD in to assess pt due to bloody urine and pain with bladder scanner just prior to arrival.  Pt continued to urinate small bloody amounts of urine.  Continue to recommend SNF upon d/c.   Follow Up Recommendations  SNF;Supervision/Assistance - 24 hour     Equipment Recommendations  None recommended by PT    Recommendations for Other Services       Precautions / Restrictions Precautions Precautions: Fall    Mobility  Bed Mobility Overal bed mobility: Needs Assistance Bed Mobility: Rolling Rolling: Min assist         General bed mobility comments: performed rolling x4 for pericare (also just changed with nursing upon arriving to room), pt also then reported further need to have BM so placed bed pan and NT notified, pt refused sitting EOB at this time, assist for lower body with rolling, pt reports tender periarea as well as pain in LEs  Transfers                    Ambulation/Gait                 Stairs            Wheelchair Mobility    Modified Rankin (Stroke Patients Only)       Balance                                            Cognition Arousal/Alertness: Awake/alert Behavior During Therapy: WFL for tasks assessed/performed Overall Cognitive Status: Difficult to assess                                 General Comments: no family present, pt does  not appear to understand questions most of the time, also changes answer - possibly HOH      Exercises      General Comments        Pertinent Vitals/Pain Pain Assessment: Faces Faces Pain Scale: Hurts even more Pain Location: periarea  Pain Descriptors / Indicators: Discomfort;Grimacing;Moaning Pain Intervention(s): Repositioned    Home Living                      Prior Function            PT Goals (current goals can now be found in the care plan section) Progress towards PT goals: Progressing toward goals    Frequency           PT Plan Current plan remains appropriate    Co-evaluation              AM-PAC PT "6 Clicks" Daily Activity  Outcome Measure  Difficulty turning over in bed (including adjusting bedclothes, sheets and blankets)?:  Total Difficulty moving from lying on back to sitting on the side of the bed? : Total Difficulty sitting down on and standing up from a chair with arms (e.g., wheelchair, bedside commode, etc,.)?: Total Help needed moving to and from a bed to chair (including a wheelchair)?: Total Help needed walking in hospital room?: Total Help needed climbing 3-5 steps with a railing? : Total 6 Click Score: 6    End of Session   Activity Tolerance: Patient limited by pain (limited by motivation) Patient left: in bed;with call bell/phone within reach;with bed alarm set Nurse Communication: Mobility status (pt on bed pan) PT Visit Diagnosis: History of falling (Z91.81);Muscle weakness (generalized) (M62.81)     Time: 1610-9604 PT Time Calculation (min) (ACUTE ONLY): 17 min  Charges:  $Therapeutic Activity: 8-22 mins                    G Codes:       Zenovia Jarred, PT, DPT 05/22/2017 Pager: [phone]   Maida Sale E 05/22/2017, 2:54 PM

## 2017-05-22 NOTE — Progress Notes (Signed)
Discussed w/ Dr.Tat r/t voiding only scant amts bloody urine and painful when palpating bladder.Urology to be consulted.Will closely monitor.

## 2017-05-22 NOTE — Progress Notes (Signed)
Spoke w/ Dr Tat r/t bloody urine and unable to get irrigant back from F/C when flushed.N.O. recieved .

## 2017-05-22 NOTE — Care Management Important Message (Signed)
Important Message  Patient Details  Name: Cristian Gates MRN: [id number] Date of Birth: 01-Dec-1941   Medicare Important Message Given:  Yes    Caren MacadamFuller, Baillie Mohammad 05/22/2017, 10:11 AMImportant Message  Patient Details  Name: Cristian Peerlbert Duggar MRN: [id number] Date of Birth: 01-Dec-1941   Medicare Important Message Given:  Yes    Caren MacadamFuller, Philomene Haff 05/22/2017, 10:11 AM

## 2017-05-22 NOTE — Progress Notes (Signed)
Have hand irrigated foley three times thru the night.  Clot load has decreased substantially.  Pt tolerated well.  Good urine output.  VSS.  Will continue to monitor

## 2017-05-23 DIAGNOSIS — D62 Acute posthemorrhagic anemia: Secondary | ICD-10-CM

## 2017-05-23 DIAGNOSIS — E43 Unspecified severe protein-calorie malnutrition: Secondary | ICD-10-CM

## 2017-05-23 LAB — CBC
HCT: 20.8 % — ABNORMAL LOW (ref 39.0–52.0)
HEMOGLOBIN: 7.3 g/dL — AB (ref 13.0–17.0)
MCH: 30.3 pg (ref 26.0–34.0)
MCHC: 35.1 g/dL (ref 30.0–36.0)
MCV: 86.3 fL (ref 78.0–100.0)
Platelets: 38 10*3/uL — ABNORMAL LOW (ref 150–400)
RBC: 2.41 MIL/uL — AB (ref 4.22–5.81)
RDW: 14.2 % (ref 11.5–15.5)
WBC: 10.7 10*3/uL — ABNORMAL HIGH (ref 4.0–10.5)

## 2017-05-23 LAB — BASIC METABOLIC PANEL
ANION GAP: 9 (ref 5–15)
BUN: 32 mg/dL — ABNORMAL HIGH (ref 6–20)
CALCIUM: 8 mg/dL — AB (ref 8.9–10.3)
CO2: 17 mmol/L — ABNORMAL LOW (ref 22–32)
Chloride: 111 mmol/L (ref 101–111)
Creatinine, Ser: 2.68 mg/dL — ABNORMAL HIGH (ref 0.61–1.24)
GFR, EST AFRICAN AMERICAN: 25 mL/min — AB (ref >60)
GFR, EST NON AFRICAN AMERICAN: 22 mL/min — AB (ref >60)
Glucose, Bld: 88 mg/dL (ref 65–99)
Potassium: 3.5 mmol/L (ref 3.5–5.1)
SODIUM: 137 mmol/L (ref 135–145)

## 2017-05-23 LAB — PREPARE PLATELET PHERESIS: UNIT DIVISION: 0

## 2017-05-23 LAB — BPAM PLATELET PHERESIS
Blood Product Expiration Date: 201806122359
ISSUE DATE / TIME: 201806111841
Unit Type and Rh: 5100

## 2017-05-23 LAB — ABO/RH: ABO/RH(D): O POS

## 2017-05-23 LAB — HEPATITIS C ANTIBODY

## 2017-05-23 LAB — HIV ANTIBODY (ROUTINE TESTING W REFLEX): HIV Screen 4th Generation wRfx: NONREACTIVE

## 2017-05-23 LAB — GLUCOSE, CAPILLARY: GLUCOSE-CAPILLARY: 81 mg/dL (ref 65–99)

## 2017-05-23 LAB — HEPATITIS B SURFACE ANTIGEN: HEP B S AG: NEGATIVE

## 2017-05-23 MED ORDER — VITAMIN B-12 1000 MCG PO TABS
500.0000 ug | ORAL_TABLET | Freq: Every day | ORAL | Status: DC
  Administered 2017-05-23 – 2017-05-26 (×4): 500 ug via ORAL
  Filled 2017-05-23 (×4): qty 1

## 2017-05-23 MED ORDER — ENSURE ENLIVE PO LIQD
237.0000 mL | Freq: Two times a day (BID) | ORAL | Status: DC
  Administered 2017-05-23 – 2017-05-25 (×4): 237 mL via ORAL

## 2017-05-23 MED ORDER — SODIUM CHLORIDE 0.9 % IR SOLN
3000.0000 mL | Status: DC
  Administered 2017-05-23: 3000 mL

## 2017-05-23 MED ORDER — ADULT MULTIVITAMIN W/MINERALS CH
1.0000 | ORAL_TABLET | Freq: Every day | ORAL | Status: DC
  Administered 2017-05-23 – 2017-05-25 (×3): 1 via ORAL
  Filled 2017-05-23 (×4): qty 1

## 2017-05-23 NOTE — Progress Notes (Signed)
Subjective: Patient is sleeping.  Denies pain.  CBIs are off.  Urine in foley bag is pink and urine in tubing is clear.   Objective: Vital signs in last 24 hours: Temp:  [97.5 F (36.4 C)-98.9 F (37.2 C)] 98.6 F (37 C) (06/12 0530) Pulse Rate:  [111-131] 116 (06/12 0530) Resp:  [16-20] 18 (06/12 0530) BP: (126-141)/(65-86) 141/82 (06/12 0530) SpO2:  [100 %] 100 % (06/12 0530)  Intake/Output from previous day: 06/11 0701 - 06/12 0700 In: 16109 [P.O.:680; I.V.:2325; Blood:265] Out: 18700 [Urine:18700] Intake/Output this shift: Total I/O In: -  Out: 1700 [Urine:1700]  Physical Exam:  General:cooperative and no distress GI: soft, non tender, no palpable masses, no organomegaly 3 way foley in place with no discharge or skin breakdown    Lab Results:  Recent Labs  05/22/17 0518 05/23/17 0530  HGB 9.1* 7.3*  HCT 26.3* 20.8*   BMET  Recent Labs  05/22/17 0518 05/23/17 0530  NA 138 137  K 3.6 3.5  CL 113* 111  CO2 19* 17*  GLUCOSE 104* 88  BUN 35* 32*  CREATININE 2.73* 2.68*  CALCIUM 8.4* 8.0*    Recent Labs  05/22/17 1518  INR 1.20   No results for input(s): LABURIN in the last 72 hours. Results for orders placed or performed during the hospital encounter of 05/18/17  Urine culture     Status: Abnormal   Collection Time: 05/18/17 10:25 AM  Result Value Ref Range Status   Specimen Description URINE, RANDOM  Final   Special Requests NONE  Final   Culture >=100,000 COLONIES/mL ESCHERICHIA COLI (A)  Final   Report Status 05/20/2017 FINAL  Final   Organism ID, Bacteria ESCHERICHIA COLI (A)  Final      Susceptibility   Escherichia coli - MIC*    AMPICILLIN 8 SENSITIVE Sensitive     CEFAZOLIN <=4 SENSITIVE Sensitive     CEFTRIAXONE <=1 SENSITIVE Sensitive     CIPROFLOXACIN <=0.25 SENSITIVE Sensitive     GENTAMICIN <=1 SENSITIVE Sensitive     IMIPENEM <=0.25 SENSITIVE Sensitive     NITROFURANTOIN 32 SENSITIVE Sensitive     TRIMETH/SULFA <=20  SENSITIVE Sensitive     AMPICILLIN/SULBACTAM 4 SENSITIVE Sensitive     PIP/TAZO <=4 SENSITIVE Sensitive     Extended ESBL NEGATIVE Sensitive     * >=100,000 COLONIES/mL ESCHERICHIA COLI    Studies/Results: US Abdomen Complete  Result Date: 05/22/2017 CLINICAL DATA:  Thrombocytopenia. EXAM: ABDOMEN ULTRASOUND COMPLETE COMPARISON:  CT 04/01/2008 FINDINGS: Gallbladder: Evidence of gallbladder sludge and stones. No significant gallbladder wall thickening or adjacent free fluid. Negative sonographic Murphy's sign. Common bile duct: Diameter: 5.3 mm. Liver: No focal lesion identified. Within normal limits in parenchymal echogenicity. IVC: No abnormality visualized. Pancreas: Not visualized. Spleen: Size and appearance within normal limits. Right Kidney: Length: 10.6 cm. Echogenicity within normal limits. 4.2 cm cyst over the mid pole. No mass or hydronephrosis visualized. Left Kidney: Length: 8.9 cm. Echogenicity within normal limits. Two small cysts measuring 1.5 cm in greatest diameter. No mass or hydronephrosis visualized. Abdominal aorta: No aneurysm visualized. Other findings: None. IMPRESSION: Mild cholelithiasis and sludge without wall thickening or additional evidence to suggest acute cholecystitis. Bilateral renal cysts. Electronically Signed   By: Elberta Fortis M.D.   On: 05/22/2017 22:06    Assessment/Plan:     UTI with hematuria--continue ABx 5-7 days.  Repeat UA after completion to ensure resolution of infection.  Hematuria improved today.  Leave CBIs off for now and hand irrigate  foley prn.  Leave foley until urine remains clear with no intervention.  He will then need a void trial and a cystoscopy after infection has resolved.   H/H dropped again today.  Plts slightly improved.  Continue to monitor with tx per IM   LOS: 5 days   [facility] Regional CenterDANCY, Cristian Mccaskill 05/23/2017, 10:24 AM

## 2017-05-23 NOTE — Progress Notes (Signed)
Initial Nutrition Assessment  DOCUMENTATION CODES:   Severe malnutrition in context of chronic illness, Underweight  INTERVENTION:   Provide Ensure Enlive po BID, each supplement provides 350 kcal and 20 grams of protein Provide [employer] Instant Breakfast once with peaches as morning snack Provide Multivitamin with minerals daily  RD to continue to monitor  NUTRITION DIAGNOSIS:   Malnutrition (Severe) related to chronic illness, wound healing as evidenced by severe depletion of body fat, severe depletion of muscle mass.  GOAL:   Patient will meet greater than or equal to 90% of their needs  MONITOR:   PO intake, Supplement acceptance, Labs, Weight trends, Skin, I & O's  REASON FOR ASSESSMENT:   Low Braden    ASSESSMENT:   76 year old male with a history of hypertension and no other document a medical problems presented after a mechanical fall at home. Further history obtained through the family revealed that he has had increasing generalized weakness since March 2018. He has had more difficulty walking exercise generalized weakness. The family has also noted increasing urinary incontinence resulting in skin breakdown. The patient was found to have acute kidney injury at the time of admission with a serum creatinine of 5.27. Because the patient has been very guarded in terms of his medical history, his family was unable to clarify when the last time he saw a physician. Nevertheless, a Foley catheter was inserted at the time of admission secondary to his renal failure.  Patient in room with wife and daughter at bedside. Pt reports not feeling well and he is not very talkative at this time. Pt's daughter reports pt does not eat anything unless it is fruit. He tried to eat some peanut butter crackers. Pt with hamburger and peaches on lunch tray. Reviewed sources of protein with pt and family. Pt's family would like for patient to try [employer] Instant Breakfast and Ensure supplements.  RD will order.  Per pt's daughter, UBW is 185 lb. Was unable to give a time frame for weight loss. Nutrition-Focused physical exam completed. Findings are severe fat depletion and severe muscle depletion.  Medications reviewed. Labs reviewed: GFR: 25 Vitamin B-12 WNL  Diet Order:  Diet regular Room service appropriate? Yes; Fluid consistency: Thin  Skin:  Wound (see comment) (Stage II pressure injuries: buttocks, sacrum)  Last BM:  6/12  Height:   Ht Readings from Last 1 Encounters:  05/18/17 6' (1.829 m)    Weight:   Wt Readings from Last 1 Encounters:  05/22/17 138 lb 0.1 oz (62.6 kg)    Ideal Body Weight:  80.9 kg  BMI:  Body mass index is 18.72 kg/m.  Estimated Nutritional Needs:   Kcal:  1700-1900  Protein:  80-90g  Fluid:  1.9L/day  EDUCATION NEEDS:   No education needs identified at this time  Tilda FrancoLindsey Gessica Jawad, MS, RD, LDN Pager: [phone] After Hours Pager: [phone]

## 2017-05-23 NOTE — Progress Notes (Signed)
PROGRESS NOTE  Cristian Gates WNU:[id number] DOB: 03-06-1941 DOA: 05/18/2017 PCP: Patient, No Pcp Per  Brief History:  76 year old male with a history of hypertension and no other document a medical problems presented after a mechanical fall at home. Further history obtained through the family revealed that he has had increasing generalized weakness since March 2018. He has had more difficulty walking exercise generalized weakness. The family has also noted increasing urinary incontinence resulting in skin breakdown. The patient was found to have acute kidney injury at the time of admission with a serum creatinine of 5.27. Because the patient has been very guarded in terms of his medical history, his family was unable to clarify when the last time he saw a physician. Nevertheless, a Foley catheter was inserted at the time of admission secondary to his renal failure. The patient developed hematuria which was initially felt to be due to catheter trauma. Urinalysis showed TNTC WBC and urine culture grew pansensitive Escherichia coli. The patient was placed on ceftriaxone initially, and this was changed to oral cefpodoxime on 05/21/2017. His urine ultimately cleared; however on 05/21/2017 evening, the patient developed hematuria again. There was difficulty flushing the patient's Foley catheter. Ultimately, his Foley catheter was discontinued. Urology was consulted to assist with management. After speaking with the patient's family, daughter and spouse revealed that in the past 2-3 weeks, the patient has had increasing lower extremity weakness to the point he is unable to walk. The patient himself denies any back pain or radicular type symptoms. There has not been any recent injury or trauma.  Assessment/Plan: AKI  -unclear renal baseline -Suspect patient has a degree of underlying CKD  -pt was taking naprosyn regularly for "gout" -05/18/2017 renal ultrasound--slightly atrophic kidneys without  hydronephrosis; echogenic bladder debris  -Improving with IV fluids  -Presenting serum creatinine 5.27 >>>> 2.73>>>2.68 -Discontinued chlorthalidone   Hemorrhagic cystitis/UTI--EColi -Initially felt hematuria may been due to Foley catheter trauma, but now patient has suprapubic tenderness and recurrent hematuria -Continue cefpodoxime -Appreciate urology consult--discussed with Dr. Janora NorlanderHerrick--place 3-way foley and start CBI-->continue CBI until urine clears, then voiding trial prior to d/c -check coags--INR 1.20, PTT 36 -05/23/17--now "kool-aid" red color  Acute Blood Loss Anemia -although partly dilutional, hematuria has resulted in blood loss -Hgb 10.6>>>7.3 -transfuse for Hgb <7 -baseline Hgb unclear  Thrombocytopenia -Continue gradual drop likely partly due to dilution -Transfuse 1 unit platelets as pt now has recurrent hematuria -Serum B12--229-->replete -HIV--neg -hep b surface antigen--neg -hep C antibody--neg -check coags--unremarkable -fibrinogen--448  Lower extremity weakness -suspect deconditioning  -?component of low B12-->replete -MRI lumbar spine  Sinus tachycardia -Likely due to acute medical illness -Continue IV fluids -Personally reviewed EKG--sinus tachycardia, nonspecific T wave change -TSH  R-knee pain -suspect gout -check uric acid   Essential hypertension -Continue amlodipine -Blood pressure controlled   generalized weakness -PT evaluation-->SNF -Serum B12--229 -TSH  Pressure injury to the skin -present at time of admission -local wound care   Severe malnutrition -Start ensure and carnation   Disposition Plan:   SNF in 1-2 days Family Communication:   spouse updated at bedside 6/12--Total time spent 35 minutes.  Greater than 50% spent face to face counseling and coordinating care.  Consultants:  urology  Code Status:  FULL   DVT Prophylaxis:  SCDs   Procedures: As Listed in Progress Note  Above  Antibiotics: Ceftriaxone 6/7>>>6/9 cefpodoxime 6/10>>>     Subjective: Patient complains of any pain. Denies any chest pain, shortness  of breath, nausea, vomiting, diarrhea, abdominal pain, dysuria, hematuria. No rashes. Denies any fevers, chills, headache. No radicular type pain to the legs. Denies any recent injury to his right knee.  Objective: Vitals:   05/22/17 1905 05/22/17 2039 05/23/17 0530 05/23/17 1325  BP: 126/86 126/86 (!) 141/82 128/73  Pulse: (!) 122 (!) 111 (!) 116 (!) 107  Resp: 20 16 18 17   Temp: 98.9 F (37.2 C) 97.6 F (36.4 C) 98.6 F (37 C) 98 F (36.7 C)  TempSrc: Oral Oral Oral Oral  SpO2: 100% 100% 100% 100%  Weight:      Height:        Intake/Output Summary (Last 24 hours) at 05/23/17 1432 Last data filed at 05/23/17 1050  Gross per 24 hour  Intake            24070 ml  Output            21720 ml  Net             2350 ml   Weight change:  Exam:   General:  Pt is alert, follows commands appropriately, not in acute distress  HEENT: No icterus, No thrush, No neck mass, [place]/AT  Cardiovascular: RRR, S1/S2, no rubs, no gallops  Respiratory: CTA bilaterally, no wheezing, no crackles, no rhonchi  Abdomen: Soft/+BS, non tender, non distended, no guarding  Extremities: No edema, No lymphangitis, No petechiae, No rashes, mild R-knee synovitis   Data Reviewed: I have personally reviewed following labs and imaging studies Basic Metabolic Panel:  Recent Labs Lab 05/18/17 1849 05/19/17 0526 05/20/17 0542 05/21/17 0513 05/22/17 0518 05/23/17 0530  NA  --  136 137 139 138 137  K  --  3.9 3.6 3.6 3.6 3.5  CL  --  111 112* 113* 113* 111  CO2  --  14* 17* 18* 19* 17*  GLUCOSE  --  76 83 87 104* 88  BUN  --  61* 50* 44* 35* 32*  CREATININE  --  4.34* 3.70* 3.01* 2.73* 2.68*  CALCIUM  --  8.6* 8.6* 8.6* 8.4* 8.0*  MG 2.0  --   --   --   --   --   PHOS 3.7  --   --   --   --   --    Liver Function Tests:  Recent Labs Lab  05/18/17 0950  AST 19  ALT 15*  ALKPHOS 60  BILITOT 1.1  PROT 6.9  ALBUMIN 3.6   No results for input(s): LIPASE, AMYLASE in the last 168 hours. No results for input(s): AMMONIA in the last 168 hours. Coagulation Profile:  Recent Labs Lab 05/22/17 1518  INR 1.20   CBC:  Recent Labs Lab 05/18/17 1849 05/19/17 0526 05/20/17 0542 05/22/17 0518 05/23/17 0530  WBC 7.4 6.9 8.2 10.9* 10.7*  NEUTROABS 6.1  --   --   --   --   HGB 10.6* 9.5* 9.8* 9.1* 7.3*  HCT 31.7* 28.0* 29.0* 26.3* 20.8*  MCV 87.8 86.4 87.1 86.2 86.3  PLT 75* 66* 63* 32* 38*   Cardiac Enzymes: No results for input(s): CKTOTAL, CKMB, CKMBINDEX, TROPONINI in the last 168 hours. BNP: Invalid input(s): POCBNP CBG:  Recent Labs Lab 05/19/17 0733 05/20/17 0814 05/21/17 0800 05/22/17 0822 05/23/17 0741  GLUCAP 77 85 89 101* 81   HbA1C: No results for input(s): HGBA1C in the last 72 hours. Urine analysis:    Component Value Date/Time   COLORURINE YELLOW 05/18/2017 1025   APPEARANCEUR TURBID (  A) 05/18/2017 1025   LABSPEC 1.012 05/18/2017 1025   PHURINE 5.0 05/18/2017 1025   GLUCOSEU NEGATIVE 05/18/2017 1025   HGBUR LARGE (A) 05/18/2017 1025   BILIRUBINUR NEGATIVE 05/18/2017 1025   KETONESUR NEGATIVE 05/18/2017 1025   PROTEINUR 100 (A) 05/18/2017 1025   NITRITE NEGATIVE 05/18/2017 1025   LEUKOCYTESUR LARGE (A) 05/18/2017 1025   Sepsis Labs: @LABRCNTIP (procalcitonin:4,lacticidven:4) ) Recent Results (from the past 240 hour(s))  Urine culture     Status: Abnormal   Collection Time: 05/18/17 10:25 AM  Result Value Ref Range Status   Specimen Description URINE, RANDOM  Final   Special Requests NONE  Final   Culture >=100,000 COLONIES/mL ESCHERICHIA COLI (A)  Final   Report Status 05/20/2017 FINAL  Final   Organism ID, Bacteria ESCHERICHIA COLI (A)  Final      Susceptibility   Escherichia coli - MIC*    AMPICILLIN 8 SENSITIVE Sensitive     CEFAZOLIN <=4 SENSITIVE Sensitive     CEFTRIAXONE  <=1 SENSITIVE Sensitive     CIPROFLOXACIN <=0.25 SENSITIVE Sensitive     GENTAMICIN <=1 SENSITIVE Sensitive     IMIPENEM <=0.25 SENSITIVE Sensitive     NITROFURANTOIN 32 SENSITIVE Sensitive     TRIMETH/SULFA <=20 SENSITIVE Sensitive     AMPICILLIN/SULBACTAM 4 SENSITIVE Sensitive     PIP/TAZO <=4 SENSITIVE Sensitive     Extended ESBL NEGATIVE Sensitive     * >=100,000 COLONIES/mL ESCHERICHIA COLI     Scheduled Meds: . amLODipine  10 mg Oral Daily  . cefpodoxime  200 mg Oral Daily  . chlorhexidine  15 mL Mouth Rinse BID  . feeding supplement (ENSURE ENLIVE)  237 mL Oral BID BM  . Gerhardt's butt cream   Topical TID  . mouth rinse  15 mL Mouth Rinse q12n4p  . multivitamin with minerals  1 tablet Oral Daily  . sodium chloride flush  3 mL Intravenous Q12H   Continuous Infusions: . sodium chloride 75 mL/hr at 05/22/17 1712  . sodium chloride irrigation      Procedures/Studies: Dg Chest 2 View  Result Date: 05/18/2017 CLINICAL DATA:  Witnessed fall at home. Generalize weakness. History of hypertension. EXAM: CHEST  2 VIEW COMPARISON:  None in PACs FINDINGS: The lungs are well-expanded and clear. The heart and pulmonary vascularity are normal. There is tortuosity of ascending and descending thoracic aorta. There is mural calcification in the aorta. The mediastinum is normal in width. There is no pleural effusion. The observed bony thorax exhibits no acute abnormality. IMPRESSION: There is no acute cardiopulmonary abnormality. Thoracic aortic atherosclerosis. Electronically Signed   By: Aloria Looper  [place] M.D.   On: 05/18/2017 11:27   Ct Head Wo Contrast  Result Date: 05/18/2017 CLINICAL DATA:  Frequent falls.  Generalized weakness. EXAM: CT HEAD WITHOUT CONTRAST TECHNIQUE: Contiguous axial images were obtained from the base of the skull through the vertex without intravenous contrast. COMPARISON:  None. FINDINGS: Brain: No evidence of acute infarction, hemorrhage, hydrocephalus, extra-axial  collection or mass lesion/mass effect. Mild-to-moderate atrophy and mild chronic microvascular ischemic change in the cerebral white matter. Vascular: Atherosclerotic calcification.  No hyperdense vessel. Skull: Negative Sinuses/Orbits: Chronic appearing right ethmoid sinusitis. IMPRESSION: 1. No acute finding 2. Atrophy and mild chronic microvascular ischemic change. Electronically Signed   By: Marnee Spring M.D.   On: 05/18/2017 11:16   US Abdomen Complete  Result Date: 05/22/2017 CLINICAL DATA:  Thrombocytopenia. EXAM: ABDOMEN ULTRASOUND COMPLETE COMPARISON:  CT 04/01/2008 FINDINGS: Gallbladder: Evidence of gallbladder sludge and stones. No significant  gallbladder wall thickening or adjacent free fluid. Negative sonographic Murphy's sign. Common bile duct: Diameter: 5.3 mm. Liver: No focal lesion identified. Within normal limits in parenchymal echogenicity. IVC: No abnormality visualized. Pancreas: Not visualized. Spleen: Size and appearance within normal limits. Right Kidney: Length: 10.6 cm. Echogenicity within normal limits. 4.2 cm cyst over the mid pole. No mass or hydronephrosis visualized. Left Kidney: Length: 8.9 cm. Echogenicity within normal limits. Two small cysts measuring 1.5 cm in greatest diameter. No mass or hydronephrosis visualized. Abdominal aorta: No aneurysm visualized. Other findings: None. IMPRESSION: Mild cholelithiasis and sludge without wall thickening or additional evidence to suggest acute cholecystitis. Bilateral renal cysts. Electronically Signed   By: Elberta Fortis M.D.   On: 05/22/2017 22:06   US Renal  Result Date: 05/18/2017 CLINICAL DATA:  Hematuria EXAM: RENAL / URINARY TRACT ULTRASOUND COMPLETE COMPARISON:  CT 04/01/2008 FINDINGS: Right Kidney: Length: 8.9 cm. Slightly atrophic. Two cysts in the midpole with the larger measuring 3.5 cm in maximum diameter. There is no hydronephrosis Left Kidney: Length: 8.6 cm. Slightly atrophic. Cysts in the mid to lower pole  measuring 1.5 cm. Bladder: Foley catheter is present in the bladder. There appears to be echogenic material within the posterior bladder. IMPRESSION: 1. Slightly atrophic kidneys. No hydronephrosis. Bilateral renal cysts. 2. Suspect echogenic material in the posterior bladder which may represent debris or possible thrombus. Electronically Signed   By: Jasmine Pang M.D.   On: 05/18/2017 19:58   Dg Hip Unilat W Or Wo Pelvis 2-3 Views Right  Result Date: 05/18/2017 CLINICAL DATA:  Right hip pain following a witnessed fall at home EXAM: DG HIP (WITH OR WITHOUT PELVIS) 2-3V RIGHT COMPARISON:  None in PACs FINDINGS: The patient is rotated on this study. The bones are subjectively adequately mineralized. There is mild symmetric narrowing of both hip joint spaces. There is no acute fracture or dislocation of the right hip. The femoral head, neck, intertrochanteric, and immediate sub trochanteric regions appear normal. IMPRESSION: There is no acute bony abnormality of the right hip. 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 Electronically Signed   By: Hortence Charter  [place] M.D.   On: 05/18/2017 11:29    Audre Cenci, DO  [facility] Pager [phone]  If 7PM-7AM, please contact night-coverage [email or web address] Password [email or web address] 05/23/2017, 2:32 PM   LOS: 5 days

## 2017-05-24 DIAGNOSIS — D62 Acute posthemorrhagic anemia: Secondary | ICD-10-CM

## 2017-05-24 LAB — BASIC METABOLIC PANEL
ANION GAP: 8 (ref 5–15)
BUN: 28 mg/dL — ABNORMAL HIGH (ref 6–20)
CALCIUM: 7.9 mg/dL — AB (ref 8.9–10.3)
CHLORIDE: 112 mmol/L — AB (ref 101–111)
CO2: 18 mmol/L — AB (ref 22–32)
CREATININE: 2.32 mg/dL — AB (ref 0.61–1.24)
GFR calc non Af Amer: 26 mL/min — ABNORMAL LOW (ref >60)
GFR, EST AFRICAN AMERICAN: 30 mL/min — AB (ref >60)
Glucose, Bld: 91 mg/dL (ref 65–99)
Potassium: 3.4 mmol/L — ABNORMAL LOW (ref 3.5–5.1)
Sodium: 138 mmol/L (ref 135–145)

## 2017-05-24 LAB — CBC
HCT: 18.7 % — ABNORMAL LOW (ref 39.0–52.0)
Hemoglobin: 6.6 g/dL — CL (ref 13.0–17.0)
MCH: 29.9 pg (ref 26.0–34.0)
MCHC: 35.3 g/dL (ref 30.0–36.0)
MCV: 84.6 fL (ref 78.0–100.0)
PLATELETS: 30 10*3/uL — AB (ref 150–400)
RBC: 2.21 MIL/uL — AB (ref 4.22–5.81)
RDW: 14.3 % (ref 11.5–15.5)
WBC: 8.7 10*3/uL (ref 4.0–10.5)

## 2017-05-24 LAB — TSH: TSH: 0.436 u[IU]/mL (ref 0.350–4.500)

## 2017-05-24 LAB — URIC ACID: Uric Acid, Serum: 8.7 mg/dL — ABNORMAL HIGH (ref 4.4–7.6)

## 2017-05-24 LAB — PREPARE RBC (CROSSMATCH)

## 2017-05-24 LAB — GLUCOSE, CAPILLARY: Glucose-Capillary: 88 mg/dL (ref 65–99)

## 2017-05-24 LAB — HEMOGLOBIN AND HEMATOCRIT, BLOOD
HCT: 23.3 % — ABNORMAL LOW (ref 39.0–52.0)
Hemoglobin: 8.2 g/dL — ABNORMAL LOW (ref 13.0–17.0)

## 2017-05-24 MED ORDER — LORAZEPAM 0.5 MG PO TABS
0.5000 mg | ORAL_TABLET | Freq: Once | ORAL | Status: AC
  Administered 2017-05-24: 0.5 mg via ORAL
  Filled 2017-05-24: qty 1

## 2017-05-24 MED ORDER — SODIUM CHLORIDE 0.9 % IV SOLN
Freq: Once | INTRAVENOUS | Status: AC
  Administered 2017-05-24: 09:00:00 via INTRAVENOUS

## 2017-05-24 NOTE — Progress Notes (Signed)
Subjective: Patient is resting.  Denies pain.  CBI was wide open with clear as water output and very light pink in bag.  Report from patient's primary nurse of difficulties with CBI and gross hematuria overnight.  Objective: Vital signs in last 24 hours: Temp:  [98 F (36.7 C)-98.9 F (37.2 C)] 98.4 F (36.9 C) (06/13 0540) Pulse Rate:  [107-112] 108 (06/13 0540) Resp:  [16-17] 16 (06/13 0540) BP: (115-138)/(60-82) 115/60 (06/13 0841) SpO2:  [100 %] 100 % (06/13 0540) Weight:  [67.5 kg (148 lb 13 oz)] 67.5 kg (148 lb 13 oz) (06/13 0540)  Intake/Output from previous day: 06/12 0701 - 06/13 0700 In: [id number] [P.O.:620; I.V.:1500] Out: [id number] [Urine:31020; Stool:1] Intake/Output this shift: Total I/O In: 1020 [P.O.:120; Other:900] Out: 2000 [Urine:2000]  Physical Exam:  General:cooperative and no distress GI: soft, non tender, no palpable masses, no organomegaly 3 way foley in place with no discharge or skin breakdown   Lab Results:  Recent Labs  05/22/17 0518 05/23/17 0530 05/24/17 0629  HGB 9.1* 7.3* 6.6*  HCT 26.3* 20.8* 18.7*   BMET  Recent Labs  05/23/17 0530 05/24/17 0629  NA 137 138  K 3.5 3.4*  CL 111 112*  CO2 17* 18*  GLUCOSE 88 91  BUN 32* 28*  CREATININE 2.68* 2.32*  CALCIUM 8.0* 7.9*    Recent Labs  05/22/17 1518  INR 1.20   No results for input(s): LABURIN in the last 72 hours. Results for orders placed or performed during the hospital encounter of 05/18/17  Urine culture     Status: Abnormal   Collection Time: 05/18/17 10:25 AM  Result Value Ref Range Status   Specimen Description URINE, RANDOM  Final   Special Requests NONE  Final   Culture >=100,000 COLONIES/mL ESCHERICHIA COLI (A)  Final   Report Status 05/20/2017 FINAL  Final   Organism ID, Bacteria ESCHERICHIA COLI (A)  Final      Susceptibility   Escherichia coli - MIC*    AMPICILLIN 8 SENSITIVE Sensitive     CEFAZOLIN <=4 SENSITIVE Sensitive     CEFTRIAXONE <=1 SENSITIVE  Sensitive     CIPROFLOXACIN <=0.25 SENSITIVE Sensitive     GENTAMICIN <=1 SENSITIVE Sensitive     IMIPENEM <=0.25 SENSITIVE Sensitive     NITROFURANTOIN 32 SENSITIVE Sensitive     TRIMETH/SULFA <=20 SENSITIVE Sensitive     AMPICILLIN/SULBACTAM 4 SENSITIVE Sensitive     PIP/TAZO <=4 SENSITIVE Sensitive     Extended ESBL NEGATIVE Sensitive     * >=100,000 COLONIES/mL ESCHERICHIA COLI    Studies/Results: [place] Abdomen Complete  Result Date: 05/22/2017 CLINICAL DATA:  Thrombocytopenia. EXAM: ABDOMEN ULTRASOUND COMPLETE COMPARISON:  CT 04/01/2008 FINDINGS: Gallbladder: Evidence of gallbladder sludge and stones. No significant gallbladder wall thickening or adjacent free fluid. Negative sonographic Murphy's sign. Common bile duct: Diameter: 5.3 mm. Liver: No focal lesion identified. Within normal limits in parenchymal echogenicity. IVC: No abnormality visualized. Pancreas: Not visualized. Spleen: Size and appearance within normal limits. Right Kidney: Length: 10.6 cm. Echogenicity within normal limits. 4.2 cm cyst over the mid pole. No mass or hydronephrosis visualized. Left Kidney: Length: 8.9 cm. Echogenicity within normal limits. Two small cysts measuring 1.5 cm in greatest diameter. No mass or hydronephrosis visualized. Abdominal aorta: No aneurysm visualized. Other findings: None. IMPRESSION: Mild cholelithiasis and sludge without wall thickening or additional evidence to suggest acute cholecystitis. Bilateral renal cysts. Electronically Signed   By: Elberta Fortisaniel  Boyle M.D.   On: 05/22/2017 22:06    Assessment/Plan:  UTI with hematuria: Continue ABx 5-7 days.  Repeat UA after completion to ensure resolution of infection.  Hematuria noted to be very mild today, though I understand there are reports of difficulty with it overnight. I recommended leaving the CBI off today and rack and recording outputs. Nursing should call us before restarting CBI as we would ideally try to get his foley out and have him  void.   H/H dropped again today to 6.6 with platelets down to 30 from 38. Patient receiving transfusion in report from nursing. Plan as per medicine team.    Follow-up pending. Will need outpatient cystoscopy.   Discussed with Dr. Marlou Porch   LOS: 6 days   Buck Mam R 05/24/2017, 9:32 AM

## 2017-05-24 NOTE — Progress Notes (Signed)
[facility] CSW will continue to follow and assist with discharge planning to SNF when medically stable.   Celso SickleKimberly Teoman Giraud, [place] Clinical Social Worker [facility] Cell#: [phone]

## 2017-05-24 NOTE — Progress Notes (Signed)
PT Cancellation Note  Patient Details Name: Cristian Gates MRN: [id number] DOB: 10-09-1941   Cancelled Treatment:     HgB 6.6  Will hold off and follow up next day.   Felecia ShellingLori Skylynne Schlechter  PTA [facility]  Acute  Rehab Pager      [phone]

## 2017-05-24 NOTE — Progress Notes (Signed)
CRITICAL VALUE ALERT  Critical Value:  hgb 6.6  Date & Time Notied:  05/24/17 [id number]  Provider Notified: Craige CottaKirby  Orders Received/Actions taken:

## 2017-05-24 NOTE — Progress Notes (Signed)
Updated Dtr this am r/t needing PRBC

## 2017-05-24 NOTE — Progress Notes (Signed)
PROGRESS NOTE [facility]   Cristian Gates   JYN:[id number] DOB: Jul 03, 1941  DOA: 05/18/2017 PCP: Patient, No Pcp Per   Brief Narrative:  Cristian Gates is a 76 y.o. male with medical history of hypertension who presented to the emergency department after mechanical fall. The patient was found to have acute kidney injury at the time of admission with creatinine of 5.7. Foley catheter was consulted which was noted to have hematuria. Hematuria was deemed to be secondary to catheter trauma and UTI which was due to Escherichia coli. However on 05/21/2017 patient developed hematuria again was placed on CBI and urology was consulted.   Subjective: Patient seen and examined, he has no complaints. Denies chest pain, shortness of breath, nausea, vomiting and dizziness.  Assessment & Plan: Acute kidney injury Unclear baseline Improving with IV fluids Suspect some degree of underlying CKD given renal ultrasound findings - slightly atrophic kidneys with hydronephrosis Continue to monitor  Hemorrhagic cystitis/UTI Escherichia coli Continue Vantin 5-7 days  Patient initially was placed on CBI, urology has stopped CBI to monitor urine color, If continued to be light pink they will remove Foley and will monitor for voiding trials  Acute blood loss anemia Check anemia panel Hemoglobin trended down today We'll transfuse 1 unit of PRBCs Check CBC in the morning  Thrombocytopenia Likely related to infectious process and hemodilution Status post 1 unit of platelet given patient had hematuria Vitamin B12 was low and repleted Otherwise negative workup  Hypertension - blood pressure remains stable Continue current home regimen Continue to monitor   DVT prophylaxis: SCDs Code Status: Full code Family Communication: None at bedside Disposition Plan: SNF in 24-48 hours  Consultants:   Urology  Procedures:   None  Antimicrobials: Anti-infectives    Start     Dose/Rate Route Frequency  Ordered Stop   05/20/17 1830  cefpodoxime (VANTIN) tablet 200 mg     200 mg Oral Daily 05/20/17 1724     05/19/17 1200  cefTRIAXone (ROCEPHIN) 1 g in dextrose 5 % 50 mL IVPB  Status:  Discontinued     1 g 100 mL/hr over 30 Minutes Intravenous Every 24 hours 05/18/17 1350 05/20/17 1724   05/18/17 1215  cefTRIAXone (ROCEPHIN) 1 g in dextrose 5 % 50 mL IVPB     1 g 100 mL/hr over 30 Minutes Intravenous  Once 05/18/17 1209 05/18/17 1344        Objective: Vitals:   05/24/17 0841 05/24/17 1203 05/24/17 1245 05/24/17 1410  BP: 115/60 139/75 130/64 133/64  Pulse:  (!) 104 (!) 101 100  Resp:  18 18 16   Temp:  97.8 F (36.6 C) 98.1 F (36.7 C) 98.2 F (36.8 C)  TempSrc:  Oral Oral Oral  SpO2:  100% 100%   Weight:      Height:        Intake/Output Summary (Last 24 hours) at 05/24/17 1542 Last data filed at 05/24/17 1500  Gross per 24 hour  Intake            19035 ml  Output            30601 ml  Net           -11566 ml   Filed Weights   05/21/17 0537 05/22/17 0433 05/24/17 0540  Weight: 62.3 kg (137 lb 5.6 oz) 62.6 kg (138 lb 0.1 oz) 67.5 kg (148 lb 13 oz)    Examination:  General exam: Appears calm and comfortable  Respiratory system: Clear to auscultation.  No wheezes,crackle or rhonchi Cardiovascular system: S1 & S2 heard, RRR. No JVD, murmurs, rubs or gallops Gastrointestinal system: Abdomen is nondistended, soft and nontender.  Genitourinary: Foley catheter with light red color  Central nervous system: Alert and oriented. No focal neurological deficits. Extremities: No pedal edema.   Skin: No rashes, lesions or ulcers Psychiatry: Judgement and insight appear normal. Mood & affect appropriate.    Data Reviewed: I have personally reviewed following labs and imaging studies  CBC:  Recent Labs Lab 05/18/17 1849 05/19/17 0526 05/20/17 0542 05/22/17 0518 05/23/17 0530 05/24/17 0629  WBC 7.4 6.9 8.2 10.9* 10.7* 8.7  NEUTROABS 6.1  --   --   --   --   --   HGB  10.6* 9.5* 9.8* 9.1* 7.3* 6.6*  HCT 31.7* 28.0* 29.0* 26.3* 20.8* 18.7*  MCV 87.8 86.4 87.1 86.2 86.3 84.6  PLT 75* 66* 63* 32* 38* 30*   Basic Metabolic Panel:  Recent Labs Lab 05/18/17 1849  05/20/17 0542 05/21/17 0513 05/22/17 0518 05/23/17 0530 05/24/17 0629  NA  --   < > 137 139 138 137 138  K  --   < > 3.6 3.6 3.6 3.5 3.4*  CL  --   < > 112* 113* 113* 111 112*  CO2  --   < > 17* 18* 19* 17* 18*  GLUCOSE  --   < > 83 87 104* 88 91  BUN  --   < > 50* 44* 35* 32* 28*  CREATININE  --   < > 3.70* 3.01* 2.73* 2.68* 2.32*  CALCIUM  --   < > 8.6* 8.6* 8.4* 8.0* 7.9*  MG 2.0  --   --   --   --   --   --   PHOS 3.7  --   --   --   --   --   --   < > = values in this interval not displayed. GFR: Estimated Creatinine Clearance: 26.7 mL/min (A) (by C-G formula based on SCr of 2.32 mg/dL (H)). Liver Function Tests:  Recent Labs Lab 05/18/17 0950  AST 19  ALT 15*  ALKPHOS 60  BILITOT 1.1  PROT 6.9  ALBUMIN 3.6   Coagulation Profile:  Recent Labs Lab 05/22/17 1518  INR 1.20   CBG:  Recent Labs Lab 05/20/17 0814 05/21/17 0800 05/22/17 0822 05/23/17 0741 05/24/17 0739  GLUCAP 85 89 101* 81 88   Lipid Profile: No results for input(s): CHOL, HDL, LDLCALC, TRIG, CHOLHDL, LDLDIRECT in the last 72 hours. Thyroid Function Tests:  Recent Labs  05/24/17 0629  TSH 0.436   Anemia Panel:  Recent Labs  05/22/17 1518  VITAMINB12 229   Sepsis Labs: No results for input(s): PROCALCITON, LATICACIDVEN in the last 168 hours.  Recent Results (from the past 240 hour(s))  Urine culture     Status: Abnormal   Collection Time: 05/18/17 10:25 AM  Result Value Ref Range Status   Specimen Description URINE, RANDOM  Final   Special Requests NONE  Final   Culture >=100,000 COLONIES/mL ESCHERICHIA COLI (A)  Final   Report Status 05/20/2017 FINAL  Final   Organism ID, Bacteria ESCHERICHIA COLI (A)  Final      Susceptibility   Escherichia coli - MIC*    AMPICILLIN 8  SENSITIVE Sensitive     CEFAZOLIN <=4 SENSITIVE Sensitive     CEFTRIAXONE <=1 SENSITIVE Sensitive     CIPROFLOXACIN <=0.25 SENSITIVE Sensitive     GENTAMICIN <=1 SENSITIVE Sensitive  IMIPENEM <=0.25 SENSITIVE Sensitive     NITROFURANTOIN 32 SENSITIVE Sensitive     TRIMETH/SULFA <=20 SENSITIVE Sensitive     AMPICILLIN/SULBACTAM 4 SENSITIVE Sensitive     PIP/TAZO <=4 SENSITIVE Sensitive     Extended ESBL NEGATIVE Sensitive     * >=100,000 COLONIES/mL ESCHERICHIA COLI     Radiology Studies: US Abdomen Complete  Result Date: 05/22/2017 CLINICAL DATA:  Thrombocytopenia. EXAM: ABDOMEN ULTRASOUND COMPLETE COMPARISON:  CT 04/01/2008 FINDINGS: Gallbladder: Evidence of gallbladder sludge and stones. No significant gallbladder wall thickening or adjacent free fluid. Negative sonographic Murphy's sign. Common bile duct: Diameter: 5.3 mm. Liver: No focal lesion identified. Within normal limits in parenchymal echogenicity. IVC: No abnormality visualized. Pancreas: Not visualized. Spleen: Size and appearance within normal limits. Right Kidney: Length: 10.6 cm. Echogenicity within normal limits. 4.2 cm cyst over the mid pole. No mass or hydronephrosis visualized. Left Kidney: Length: 8.9 cm. Echogenicity within normal limits. Two small cysts measuring 1.5 cm in greatest diameter. No mass or hydronephrosis visualized. Abdominal aorta: No aneurysm visualized. Other findings: None. IMPRESSION: Mild cholelithiasis and sludge without wall thickening or additional evidence to suggest acute cholecystitis. Bilateral renal cysts. Electronically Signed   By: Elberta Fortis M.D.   On: 05/22/2017 22:06    Scheduled Meds: . amLODipine  10 mg Oral Daily  . cefpodoxime  200 mg Oral Daily  . chlorhexidine  15 mL Mouth Rinse BID  . feeding supplement (ENSURE ENLIVE)  237 mL Oral BID BM  . Gerhardt's butt cream   Topical TID  . mouth rinse  15 mL Mouth Rinse q12n4p  . multivitamin with minerals  1 tablet Oral Daily  .  sodium chloride flush  3 mL Intravenous Q12H  . vitamin B-12  500 mcg Oral Daily   Continuous Infusions: . sodium chloride 75 mL/hr at 05/23/17 2253  . sodium chloride irrigation       LOS: 6 days    Time spent: Total of 25 minutes spent with pt, greater than 50% of which was spent in discussion of  treatment, counseling and coordination of care   Latrelle Dodrill, MD Pager: Text Page via [email or web address]  [phone]  If 7PM-7AM, please contact night-coverage [email or web address] Password [facility] 05/24/2017, 3:42 PM

## 2017-05-25 DIAGNOSIS — D696 Thrombocytopenia, unspecified: Secondary | ICD-10-CM

## 2017-05-25 DIAGNOSIS — N390 Urinary tract infection, site not specified: Secondary | ICD-10-CM

## 2017-05-25 LAB — FERRITIN: FERRITIN: 83 ng/mL (ref 24–336)

## 2017-05-25 LAB — BPAM RBC
BLOOD PRODUCT EXPIRATION DATE: 201806302359
ISSUE DATE / TIME: 201806131221
Unit Type and Rh: 5100

## 2017-05-25 LAB — TYPE AND SCREEN
ABO/RH(D): O POS
Antibody Screen: NEGATIVE
UNIT DIVISION: 0

## 2017-05-25 LAB — CBC WITH DIFFERENTIAL/PLATELET
BASOS PCT: 0 %
Basophils Absolute: 0 10*3/uL (ref 0.0–0.1)
EOS PCT: 2 %
Eosinophils Absolute: 0.2 10*3/uL (ref 0.0–0.7)
HEMATOCRIT: 22.6 % — AB (ref 39.0–52.0)
Hemoglobin: 8 g/dL — ABNORMAL LOW (ref 13.0–17.0)
Lymphocytes Relative: 11 %
Lymphs Abs: 1.1 10*3/uL (ref 0.7–4.0)
MCH: 30.3 pg (ref 26.0–34.0)
MCHC: 35.4 g/dL (ref 30.0–36.0)
MCV: 85.6 fL (ref 78.0–100.0)
MONO ABS: 0.7 10*3/uL (ref 0.1–1.0)
MONOS PCT: 7 %
NEUTROS ABS: 7.9 10*3/uL — AB (ref 1.7–7.7)
Neutrophils Relative %: 80 %
Platelets: 38 10*3/uL — ABNORMAL LOW (ref 150–400)
RBC: 2.64 MIL/uL — ABNORMAL LOW (ref 4.22–5.81)
RDW: 14.2 % (ref 11.5–15.5)
WBC: 9.9 10*3/uL (ref 4.0–10.5)

## 2017-05-25 LAB — RETICULOCYTES
RBC.: 2.64 MIL/uL — AB (ref 4.22–5.81)
RETIC CT PCT: 4.1 % — AB (ref 0.4–3.1)
Retic Count, Absolute: 108.2 10*3/uL (ref 19.0–186.0)

## 2017-05-25 LAB — BASIC METABOLIC PANEL
Anion gap: 8 (ref 5–15)
BUN: 25 mg/dL — AB (ref 6–20)
CALCIUM: 8.1 mg/dL — AB (ref 8.9–10.3)
CO2: 17 mmol/L — AB (ref 22–32)
Chloride: 112 mmol/L — ABNORMAL HIGH (ref 101–111)
Creatinine, Ser: 2.43 mg/dL — ABNORMAL HIGH (ref 0.61–1.24)
GFR calc Af Amer: 29 mL/min — ABNORMAL LOW (ref >60)
GFR calc non Af Amer: 25 mL/min — ABNORMAL LOW (ref >60)
GLUCOSE: 85 mg/dL (ref 65–99)
Potassium: 3.6 mmol/L (ref 3.5–5.1)
Sodium: 137 mmol/L (ref 135–145)

## 2017-05-25 LAB — IRON AND TIBC
Iron: 29 ug/dL — ABNORMAL LOW (ref 45–182)
SATURATION RATIOS: 18 % (ref 17.9–39.5)
TIBC: 162 ug/dL — ABNORMAL LOW (ref 250–450)
UIBC: 133 ug/dL

## 2017-05-25 LAB — GLUCOSE, CAPILLARY: Glucose-Capillary: 93 mg/dL (ref 65–99)

## 2017-05-25 LAB — VITAMIN B12: Vitamin B-12: 439 pg/mL (ref 180–914)

## 2017-05-25 LAB — FOLATE: FOLATE: 6.9 ng/mL (ref >5.9)

## 2017-05-25 MED ORDER — FERROUS SULFATE 325 (65 FE) MG PO TABS
325.0000 mg | ORAL_TABLET | Freq: Three times a day (TID) | ORAL | Status: DC
Start: 2017-05-26 — End: 2017-05-26
  Administered 2017-05-26 (×2): 325 mg via ORAL
  Filled 2017-05-25: qty 1

## 2017-05-25 MED ORDER — SODIUM CHLORIDE 0.9 % IV SOLN
510.0000 mg | Freq: Once | INTRAVENOUS | Status: AC
  Administered 2017-05-25: 510 mg via INTRAVENOUS
  Filled 2017-05-25: qty 17

## 2017-05-25 NOTE — Progress Notes (Signed)
  Subjective: Patient is resting.  Denies pain.    Objective: Vital signs in last 24 hours: Temp:  [97.5 F (36.4 C)-98.5 F (36.9 C)] 98.2 F (36.8 C) (06/14 1355) Pulse Rate:  [101-107] 102 (06/14 1355) Resp:  [16-18] 16 (06/14 1355) BP: (114-137)/(62-81) 114/62 (06/14 1355) SpO2:  [100 %] 100 % (06/14 1355) Weight:  [68.9 kg (151 lb 14.4 oz)] 68.9 kg (151 lb 14.4 oz) (06/14 0543)  Intake/Output from previous day: 06/13 0701 - 06/14 0700 In: 3771.3 [P.O.:360; I.V.:2186.3; Blood:325] Out: 4602 [Urine:4600; Stool:2] Intake/Output this shift: Total I/O In: 120 [P.O.:120] Out: -   Physical Exam:  General:cooperative and no distress GI: soft, non tender, no palpable masses, no organomegaly 3 way foley in place with no discharge or skin breakdown - urine straw colored  Lab Results:  Recent Labs  05/24/17 0629 05/24/17 1824 05/25/17 0517  HGB 6.6* 8.2* 8.0*  HCT 18.7* 23.3* 22.6*   BMET  Recent Labs  05/24/17 0629 05/25/17 0517  NA 138 137  K 3.4* 3.6  CL 112* 112*  CO2 18* 17*  GLUCOSE 91 85  BUN 28* 25*  CREATININE 2.32* 2.43*  CALCIUM 7.9* 8.1*    Recent Labs  05/22/17 1518  INR 1.20   No results for input(s): LABURIN in the last 72 hours. Results for orders placed or performed during the hospital encounter of 05/18/17  Urine culture     Status: Abnormal   Collection Time: 05/18/17 10:25 AM  Result Value Ref Range Status   Specimen Description URINE, RANDOM  Final   Special Requests NONE  Final   Culture >=100,000 COLONIES/mL ESCHERICHIA COLI (A)  Final   Report Status 05/20/2017 FINAL  Final   Organism ID, Bacteria ESCHERICHIA COLI (A)  Final      Susceptibility   Escherichia coli - MIC*    AMPICILLIN 8 SENSITIVE Sensitive     CEFAZOLIN <=4 SENSITIVE Sensitive     CEFTRIAXONE <=1 SENSITIVE Sensitive     CIPROFLOXACIN <=0.25 SENSITIVE Sensitive     GENTAMICIN <=1 SENSITIVE Sensitive     IMIPENEM <=0.25 SENSITIVE Sensitive     NITROFURANTOIN  32 SENSITIVE Sensitive     TRIMETH/SULFA <=20 SENSITIVE Sensitive     AMPICILLIN/SULBACTAM 4 SENSITIVE Sensitive     PIP/TAZO <=4 SENSITIVE Sensitive     Extended ESBL NEGATIVE Sensitive     * >=100,000 COLONIES/mL ESCHERICHIA COLI    Studies/Results: No results found.  Assessment/Plan:    UTI with hematuria:  This has not completely resolved but CBI has been off since yesterday.  Urine straw colored at this point.  Recommend a voiding in the AM.   Continue ABx 5-7 days.  Will complete hematuria w/u once infection has cleared.  He has follow-up scheduled with me in 3-4 weeks.     LOS: 7 days   Crist FatHERRICK, Laruen Risser W 05/25/2017, 2:14 PM

## 2017-05-25 NOTE — Progress Notes (Signed)
Physical Therapy Treatment Patient Details Name: Cristian Gates MRN: [id number] DOB: Feb 05, 1941 Today's Date: 05/25/2017    History of Present Illness Cristian Gates is a 76 y.o. male with medical history significant for hypertension, osteoarthritis [facility]. Patient has been feeling weak . negative for fracture of right hip. Blood in  foley catheter.    PT Comments    Only performed bed mobility side to side rolling as pt declined any OOB attempt to use BSC or change position to sit in recliner.  Pt required + 2 assist.  Repositioned to comfort.   Follow Up Recommendations  SNF;Supervision/Assistance - 24 hour     Equipment Recommendations  None recommended by PT    Recommendations for Other Services       Precautions / Restrictions Precautions Precautions: Fall Precaution Comments: non compliant most of time Restrictions Weight Bearing Restrictions: No Other Position/Activity Restrictions: "I haven't walked in years"    Mobility  Bed Mobility Overal bed mobility: Needs Assistance Bed Mobility: Rolling Rolling: Mod assist         General bed mobility comments: side to side rolling severakl time due to incont BM and proper peri care.  Pt moaing with pain and required slow moving motions.  Noted B LE hip and knee flexion tightness as well as ADD tightness.   Transfers                 General transfer comment: [facility].  "I can't walk".  "No, not today"  Ambulation/Gait                 Stairs            Wheelchair Mobility    Modified Rankin (Stroke Patients Only)       Balance                                            Cognition Arousal/Alertness: Awake/alert                                     General Comments: alert and oriented following all commands and quick to tell you, "I'm not doing that", and "go get me..........."      Exercises      General Comments        Pertinent Vitals/Pain Pain Assessment: Faces Faces Pain Scale: Hurts little more Pain Location: periarea and R LE with movement Pain Descriptors / Indicators: Discomfort;Grimacing;Moaning Pain Intervention(s): Repositioned;Monitored during session    Home Living                      Prior Function            PT Goals (current goals can now be found in the care plan section) Progress towards PT goals: Progressing toward goals    Frequency    Min 3X/week      PT Plan Current plan remains appropriate    Co-evaluation              AM-PAC PT "6 Clicks" Daily Activity  Outcome Measure  Difficulty turning over in bed (including adjusting bedclothes, sheets and blankets)?: Total Difficulty moving from lying on back to sitting on the  side of the bed? : Total Difficulty sitting down on and standing up from a chair with arms (e.g., wheelchair, bedside commode, etc,.)?: Total Help needed moving to and from a bed to chair (including a wheelchair)?: Total Help needed walking in hospital room?: Total Help needed climbing 3-5 steps with a railing? : Total 6 Click Score: 6    End of Session   Activity Tolerance: Patient limited by pain (pt self limiting) Patient left: in bed;with call bell/phone within reach;with bed alarm set Nurse Communication: Other (comment) (NT in room with me) PT Visit Diagnosis: History of falling (Z91.81);Muscle weakness (generalized) (M62.81)     Time: 1600-1610 PT Time Calculation (min) (ACUTE ONLY): 10 min  Charges:  $Therapeutic Activity: 8-22 mins                    G Codes:       Felecia Shelling  PTA [facility]  Acute  Rehab Pager      [phone]

## 2017-05-25 NOTE — Progress Notes (Addendum)
PROGRESS NOTE [facility]   Cristian Gates   UEA:[id number] DOB: [birth year]  DOA: 05/18/2017 PCP: Patient, No Pcp Per   Brief Narrative:  Cristian Gates is a 76 y.o. male with medical history of hypertension who presented to the emergency department after mechanical fall. The patient was found to have acute kidney injury at the time of admission with creatinine of 5.7. Foley catheter was consulted which was noted to have hematuria. Hematuria was deemed to be secondary to catheter trauma and UTI which was due to Escherichia coli. However on 05/21/2017 patient developed hematuria again was placed on CBI and urology was consulted.   Subjective: Patient seen and examined has no complaints today he wants to go home. CBI as being off, urine slight bloody but clearing out.  Assessment & Plan: Acute kidney injury Unclear baseline Improved with IVF  Suspect some degree of underlying CKD given renal ultrasound findings - slightly atrophic kidneys with hydronephrosis, I suspect that Cr is beginning to plateau. Will check BMP in AM   Hemorrhagic cystitis/UTI Escherichia coli Continue Vantin 5-7 days  Patient initially was placed on CBI, urology has stopped CBI to monitor urine color Off CBI since yesterday, Urology recommending voiding trial tomorrow and follow up as outpatient   Acute blood loss anemia and Iron def  Low Iron  s/p 1 unit of PRBCs. Hgb stable after transfusion  Fereheme given today  Will start Iron supplement  Check CBC in the morning  Thrombocytopenia - improving  Likely related to infectious process and hemodilution Status post 1 unit of platelet given patient had hematuria Vitamin B12 was low and repleted Otherwise negative workup Expect to improve with time   Hypertension - blood pressure remains stable Continue current home regimen Continue to monitor  DVT prophylaxis: SCDs Code Status: Full code Family Communication: None at bedside Disposition Plan: SNF in  AM if remains stable and patient void well   Consultants:   Urology  Procedures:   None  Antimicrobials: Anti-infectives    Start     Dose/Rate Route Frequency Ordered Stop   05/20/17 1830  cefpodoxime (VANTIN) tablet 200 mg     200 mg Oral Daily 05/20/17 1724     05/19/17 1200  cefTRIAXone (ROCEPHIN) 1 g in dextrose 5 % 50 mL IVPB  Status:  Discontinued     1 g 100 mL/hr over 30 Minutes Intravenous Every 24 hours 05/18/17 1350 05/20/17 1724   05/18/17 1215  cefTRIAXone (ROCEPHIN) 1 g in dextrose 5 % 50 mL IVPB     1 g 100 mL/hr over 30 Minutes Intravenous  Once 05/18/17 1209 05/18/17 1344       Objective: Vitals:   05/24/17 2037 05/25/17 0543 05/25/17 0906 05/25/17 1355  BP: 123/72 128/63 134/72 114/62  Pulse: (!) 103 (!) 104 (!) 107 (!) 102  Resp: 18 18  16   Temp: 98.4 F (36.9 C) 98.5 F (36.9 C)  98.2 F (36.8 C)  TempSrc: Oral Oral  Oral  SpO2: 100% 100% 100% 100%  Weight:  68.9 kg (151 lb 14.4 oz)    Height:        Intake/Output Summary (Last 24 hours) at 05/25/17 1701 Last data filed at 05/25/17 1605  Gross per 24 hour  Intake             1920 ml  Output             1401 ml  Net  519 ml   Filed Weights   05/22/17 0433 05/24/17 0540 05/25/17 0543  Weight: 62.6 kg (138 lb 0.1 oz) 67.5 kg (148 lb 13 oz) 68.9 kg (151 lb 14.4 oz)    Examination:  General: Pt is alert, awake, not in acute distress Cardiovascular: RRR, S1/S2 +SM, no rubs, no gallops Respiratory: CTA bilaterally, no wheezing, no rhonchi Abdominal: Soft, NT, ND, bowel sounds + GU: Foley cath in place urine slight bloody, darker than yesterday  Extremities: no edema, no cyanosis   Data Reviewed: I have personally reviewed following labs and imaging studies  CBC:  Recent Labs Lab 05/18/17 1849  05/20/17 0542 05/22/17 0518 05/23/17 0530 05/24/17 0629 05/24/17 1824 05/25/17 0517  WBC 7.4  < > 8.2 10.9* 10.7* 8.7  --  9.9  NEUTROABS 6.1  --   --   --   --   --   --   7.9*  HGB 10.6*  < > 9.8* 9.1* 7.3* 6.6* 8.2* 8.0*  HCT 31.7*  < > 29.0* 26.3* 20.8* 18.7* 23.3* 22.6*  MCV 87.8  < > 87.1 86.2 86.3 84.6  --  85.6  PLT 75*  < > 63* 32* 38* 30*  --  38*  < > = values in this interval not displayed. Basic Metabolic Panel:  Recent Labs Lab 05/18/17 1849  05/21/17 0513 05/22/17 0518 05/23/17 0530 05/24/17 0629 05/25/17 0517  NA  --   < > 139 138 137 138 137  K  --   < > 3.6 3.6 3.5 3.4* 3.6  CL  --   < > 113* 113* 111 112* 112*  CO2  --   < > 18* 19* 17* 18* 17*  GLUCOSE  --   < > 87 104* 88 91 85  BUN  --   < > 44* 35* 32* 28* 25*  CREATININE  --   < > 3.01* 2.73* 2.68* 2.32* 2.43*  CALCIUM  --   < > 8.6* 8.4* 8.0* 7.9* 8.1*  MG 2.0  --   --   --   --   --   --   PHOS 3.7  --   --   --   --   --   --   < > = values in this interval not displayed. GFR: Estimated Creatinine Clearance: 26 mL/min (A) (by C-G formula based on SCr of 2.43 mg/dL (H)). Liver Function Tests: No results for input(s): AST, ALT, ALKPHOS, BILITOT, PROT, ALBUMIN in the last 168 hours. Coagulation Profile:  Recent Labs Lab 05/22/17 1518  INR 1.20   CBG:  Recent Labs Lab 05/21/17 0800 05/22/17 0822 05/23/17 0741 05/24/17 0739 05/25/17 0726  GLUCAP 89 101* 81 88 93   Lipid Profile: No results for input(s): CHOL, HDL, LDLCALC, TRIG, CHOLHDL, LDLDIRECT in the last 72 hours. Thyroid Function Tests:  Recent Labs  05/24/17 0629  TSH 0.436   Anemia Panel:  Recent Labs  05/25/17 0517  VITAMINB12 439  FOLATE 6.9  FERRITIN 83  TIBC 162*  IRON 29*  RETICCTPCT 4.1*   Sepsis Labs: No results for input(s): PROCALCITON, LATICACIDVEN in the last 168 hours.  Recent Results (from the past 240 hour(s))  Urine culture     Status: Abnormal   Collection Time: 05/18/17 10:25 AM  Result Value Ref Range Status   Specimen Description URINE, RANDOM  Final   Special Requests NONE  Final   Culture >=100,000 COLONIES/mL ESCHERICHIA COLI (A)  Final   Report Status  05/20/2017 FINAL  Final   Organism ID, Bacteria ESCHERICHIA COLI (A)  Final      Susceptibility   Escherichia coli - MIC*    AMPICILLIN 8 SENSITIVE Sensitive     CEFAZOLIN <=4 SENSITIVE Sensitive     CEFTRIAXONE <=1 SENSITIVE Sensitive     CIPROFLOXACIN <=0.25 SENSITIVE Sensitive     GENTAMICIN <=1 SENSITIVE Sensitive     IMIPENEM <=0.25 SENSITIVE Sensitive     NITROFURANTOIN 32 SENSITIVE Sensitive     TRIMETH/SULFA <=20 SENSITIVE Sensitive     AMPICILLIN/SULBACTAM 4 SENSITIVE Sensitive     PIP/TAZO <=4 SENSITIVE Sensitive     Extended ESBL NEGATIVE Sensitive     * >=100,000 COLONIES/mL ESCHERICHIA COLI     Radiology Studies: No results found.  Scheduled Meds: . amLODipine  10 mg Oral Daily  . cefpodoxime  200 mg Oral Daily  . chlorhexidine  15 mL Mouth Rinse BID  . feeding supplement (ENSURE ENLIVE)  237 mL Oral BID BM  . mouth rinse  15 mL Mouth Rinse q12n4p  . multivitamin with minerals  1 tablet Oral Daily  . sodium chloride flush  3 mL Intravenous Q12H  . vitamin B-12  500 mcg Oral Daily   Continuous Infusions: . sodium chloride 75 mL/hr at 05/25/17 1154  . sodium chloride irrigation       LOS: 7 days    Time spent: Total of 15 minutes spent with pt, greater than 50% of which was spent in discussion of  treatment, counseling and coordination of care   Latrelle DodrillEdwin Silva, MD Pager: Text Page via [email or web address]  [phone]  If 7PM-7AM, please contact night-coverage [email or web address] Password [email or web address] 05/25/2017, 5:01 PM

## 2017-05-26 LAB — GLUCOSE, CAPILLARY
GLUCOSE-CAPILLARY: 101 mg/dL — AB (ref 65–99)
GLUCOSE-CAPILLARY: 96 mg/dL (ref 65–99)

## 2017-05-26 LAB — BASIC METABOLIC PANEL
Anion gap: 6 (ref 5–15)
BUN: 24 mg/dL — ABNORMAL HIGH (ref 6–20)
CALCIUM: 8.3 mg/dL — AB (ref 8.9–10.3)
CO2: 20 mmol/L — AB (ref 22–32)
CREATININE: 2.59 mg/dL — AB (ref 0.61–1.24)
Chloride: 112 mmol/L — ABNORMAL HIGH (ref 101–111)
GFR, EST AFRICAN AMERICAN: 26 mL/min — AB (ref >60)
GFR, EST NON AFRICAN AMERICAN: 23 mL/min — AB (ref >60)
Glucose, Bld: 99 mg/dL (ref 65–99)
Potassium: 3.5 mmol/L (ref 3.5–5.1)
SODIUM: 138 mmol/L (ref 135–145)

## 2017-05-26 LAB — CBC
HCT: 22.9 % — ABNORMAL LOW (ref 39.0–52.0)
Hemoglobin: 8.1 g/dL — ABNORMAL LOW (ref 13.0–17.0)
MCH: 30.5 pg (ref 26.0–34.0)
MCHC: 35.4 g/dL (ref 30.0–36.0)
MCV: 86.1 fL (ref 78.0–100.0)
PLATELETS: 40 10*3/uL — AB (ref 150–400)
RBC: 2.66 MIL/uL — ABNORMAL LOW (ref 4.22–5.81)
RDW: 14.1 % (ref 11.5–15.5)
WBC: 11 10*3/uL — ABNORMAL HIGH (ref 4.0–10.5)

## 2017-05-26 MED ORDER — FERROUS SULFATE 325 (65 FE) MG PO TABS: 325.0000 mg | ORAL_TABLET | Freq: Three times a day (TID) | ORAL | 3 refills | Status: AC

## 2017-05-26 MED ORDER — CYANOCOBALAMIN 500 MCG PO TABS: 500.0000 ug | ORAL_TABLET | Freq: Every day | ORAL | Status: AC

## 2017-05-26 MED ORDER — SENNOSIDES-DOCUSATE SODIUM 8.6-50 MG PO TABS: 1.0000 | ORAL_TABLET | Freq: Every day | ORAL | Status: AC

## 2017-05-26 MED ORDER — ENSURE ENLIVE PO LIQD: 237.0000 mL | Freq: Two times a day (BID) | ORAL | 12 refills | Status: AC

## 2017-05-26 NOTE — Discharge Summary (Addendum)
Physician Discharge Summary  Cristian Gates  ZOX:[id number]  DOB: [birth year]  DOA: 05/18/2017 PCP: Patient, No Pcp Per  Admit date: 05/18/2017 Discharge date: 05/26/2017  Admitted From: Home  Disposition:  SNF   Recommendations for Outpatient Follow-up:  1. Follow up with PCP in 1-2 weeks 2. Please obtain BMP/CBC in one week to monitor Cr and Hgb  3. Follow up with urology in 2 weeks for cystoscopy  4. Need to follow up with nephrology in 1-2 weeks   Discharge Condition: Improved  CODE STATUS: FULL  Diet recommendation: Heart Healthy   Brief/Interim Summary: Cristian Gates is a 76 y.o. male with medical history of hypertension who presented to the emergency department after mechanical fall. The patient was found to have acute kidney injury at the time of admission with creatinine of 5.7. Foley catheter was inserted in which was noted to have hematuria. Hematuria was deemed to be secondary to catheter trauma and UTI which was due to Escherichia coli. However on 05/21/2017 patient developed hematuria again was placed on CBI and urology was consulted. Subsequently hematuria cleared up CBI was discontinued and Foley removal with successful voiding trial. Urology planning to do workup for hematuria once infection was clear. Creatinine plateau at around 2.5. Patient was transfused 1 unit of PRBCs while on inpatient and he was noted to have iron deficiency anemia for which he was started on iron supplement IV iron one dose was given. Patient has clinically improve and has been stable he will be discharged to SNF for physical therapy and rehabilitation. He is recommended to have patient follow up with nephrologist and urologist for further recommendations.  Subjective: Patient seen and examined on the day of discharge. He has no complaints, continued to [place] when he is continued to be discharged. Foley catheter removed successful void trial with yellow urine. No acute events overnight patient remains  afebrile.  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 Encourage oral hydration  Check BMP in 1 week  Nephrology follow up recommended to establish care.   Hemorrhagic cystitis/UTI Escherichia coli Completed course of abx with Vantin x 7 days  Patient initially was placed on CBI, urology has stopped CBI to monitor urine color Foley was removed and had successful void trial.   Follow up with Urology in 2 weeks   Acute blood loss anemia and Iron def  Low Iron  s/p 1 unit of PRBCs. Hgb stable after transfusion  Fereheme given during hospital stay  Continue Iron supplement  Check CBC in 1 week   Thrombocytopenia - improving  Likely related to infectious process and hemodilution Status post 1 unit of platelet given patient had hematuria Vitamin B12 was low and repleted Otherwise negative workup Expect to improve with time   Hypertension - blood pressure remains stable Continue current home regimen Continue to monitor  Severe malnutrition  Continue feeding supplements   All other chronic medical condition were stable during the hospitalization.  Patient was seen by physical therapy, recommending SNF. On the day of the discharge the patient's vitals were stable, and no other acute medical condition were reported by patient. Patient was felt safe to be discharge to SNF  Discharge Instructions  You were cared  for by a hospitalist during your hospital stay. If you have any questions about your discharge medications or the care you received while you were in the hospital after you are discharged, you can call the unit and asked to speak with the hospitalist on call if the  hospitalist that took care of you is not available. Once you are discharged, your primary care physician will handle any further medical issues. Please note that NO REFILLS for any discharge medications will be authorized once you are discharged, as it is imperative that you return to your primary care physician (or establish a relationship with a primary care physician if you do not have one) for your aftercare needs so that they can reassess your need for medications and monitor your lab values.  Discharge Instructions    Call MD for:  difficulty breathing, headache or visual disturbances    Complete by:  As directed    Call MD for:  extreme fatigue    Complete by:  As directed    Call MD for:  hives    Complete by:  As directed    Call MD for:  persistant dizziness or light-headedness    Complete by:  As directed    Call MD for:  persistant nausea and vomiting    Complete by:  As directed    Call MD for:  redness, tenderness, or signs of infection (pain, swelling, redness, odor or green/yellow discharge around incision site)    Complete by:  As directed    Call MD for:  severe uncontrolled pain    Complete by:  As directed    Call MD for:  temperature >100.4    Complete by:  As directed    Diet - low sodium heart healthy    Complete by:  As directed    Increase activity slowly    Complete by:  As directed      Allergies as of 05/26/2017   No Known Allergies     Medication List    STOP taking these medications   naproxen 500 MG tablet Commonly known as:  NAPROSYN     TAKE these medications   amLODipine 10 MG tablet Commonly known as:  NORVASC Take 10 mg by mouth daily.   atenolol-chlorthalidone 50-25 MG tablet Commonly known as:  TENORETIC Take 1 tablet by mouth daily.   cyanocobalamin 500 MCG tablet Take 1 tablet (500 mcg total) by mouth daily. Start taking on:  05/27/2017   feeding supplement (ENSURE ENLIVE) Liqd Take 237 mLs by mouth 2 (two) times daily between  meals.   ferrous sulfate 325 (65 FE) MG tablet Take 1 tablet (325 mg total) by mouth 3 (three) times daily with meals.   senna-docusate 8.6-50 MG tablet Commonly known as:  Senokot-S Take 1 tablet by mouth at bedtime.       Contact information for follow-up providers    Crist FatHerrick, Benjamin W, MD. Schedule an appointment as soon as possible for a visit in 1 month(s).   Specialty:  Urology Contact information: [address] [place] [address] [phone]            Contact information for after-discharge care    Destination    [facility] Follow up.   Specialty:  Skilled Nursing Facility Contact information: [address] [place] [id number] [phone]                 No Known Allergies  Consultations:  Urology  Procedures/Studies: Dg Chest 2 View  Result Date: 05/18/2017 CLINICAL DATA:  Witnessed fall at home. Generalize weakness. History of hypertension. EXAM: CHEST  2 VIEW COMPARISON:  None in PACs FINDINGS: The lungs are well-expanded and clear. The heart and pulmonary vascularity are normal. There is tortuosity of ascending and descending thoracic aorta. There is mural calcification in the aorta. The mediastinum is normal in width. There is no pleural effusion. The observed bony thorax exhibits no acute abnormality. IMPRESSION: There is no acute cardiopulmonary abnormality. Thoracic aortic atherosclerosis. Electronically Signed   By: David  [place] M.D.   On: 05/18/2017 11:27   Ct Head Wo Contrast  Result Date: 05/18/2017 CLINICAL DATA:  Frequent falls.  Generalized weakness. EXAM: CT HEAD WITHOUT CONTRAST TECHNIQUE: Contiguous axial images were obtained from the base of the skull through the vertex without intravenous contrast. COMPARISON:  None. FINDINGS: Brain: No evidence of acute infarction, hemorrhage, hydrocephalus, extra-axial collection or mass lesion/mass effect. Mild-to-moderate atrophy and mild chronic microvascular  ischemic change in the cerebral white matter. Vascular: Atherosclerotic calcification.  No hyperdense vessel. Skull: Negative Sinuses/Orbits: Chronic appearing right ethmoid sinusitis. IMPRESSION: 1. No acute finding 2. Atrophy and mild chronic microvascular ischemic change. Electronically Signed   By: Marnee Spring M.D.   On: 05/18/2017 11:16   US Abdomen Complete  Result Date: 05/22/2017 CLINICAL DATA:  Thrombocytopenia. EXAM: ABDOMEN ULTRASOUND COMPLETE COMPARISON:  CT 04/01/2008 FINDINGS: Gallbladder: Evidence of gallbladder sludge and stones. No significant gallbladder wall thickening or adjacent free fluid. Negative sonographic Murphy's sign. Common bile duct: Diameter: 5.3 mm. Liver: No focal lesion identified. Within normal limits in parenchymal echogenicity. IVC: No abnormality visualized. Pancreas: Not visualized. Spleen: Size and appearance within normal limits. Right Kidney: Length: 10.6 cm. Echogenicity within normal limits. 4.2 cm cyst over the mid pole. No mass or hydronephrosis visualized. Left Kidney: Length: 8.9 cm. Echogenicity within normal limits. Two small cysts measuring 1.5 cm in greatest diameter. No mass or hydronephrosis visualized. Abdominal aorta: No aneurysm visualized. Other findings: None. IMPRESSION: Mild cholelithiasis and sludge without wall thickening or additional evidence to suggest acute cholecystitis. Bilateral renal cysts. Electronically Signed   By: Elberta Fortis M.D.   On: 05/22/2017 22:06   US Renal  Result Date: 05/18/2017 CLINICAL DATA:  Hematuria EXAM: RENAL / URINARY TRACT ULTRASOUND COMPLETE COMPARISON:  CT 04/01/2008 FINDINGS: Right Kidney: Length: 8.9 cm. Slightly atrophic. Two cysts in the midpole with the larger measuring 3.5 cm in maximum diameter. There is no hydronephrosis Left Kidney: Length: 8.6 cm. Slightly atrophic. Cysts in the mid to lower pole measuring 1.5 cm. Bladder: Foley catheter is present in the bladder. There appears to be echogenic  material within the posterior bladder. IMPRESSION: 1. Slightly atrophic kidneys. No hydronephrosis. Bilateral renal cysts. 2. Suspect echogenic material in the posterior bladder which may represent debris or possible thrombus. Electronically Signed   By: Jasmine Pang M.D.   On: 05/18/2017 19:58   Dg Hip Unilat W Or Wo Pelvis 2-3 Views Right  Result Date: 05/18/2017 CLINICAL DATA:  Right hip pain following a witnessed fall at home EXAM: DG HIP (WITH OR WITHOUT PELVIS) 2-3V RIGHT COMPARISON:  None in PACs FINDINGS: The patient is rotated on this study. The bones are subjectively adequately mineralized. There is mild symmetric narrowing of both hip joint spaces. There is no acute fracture or dislocation of the right hip. The femoral head, neck, intertrochanteric, and immediate sub trochanteric regions appear normal. IMPRESSION: There is no acute bony abnormality of the right hip.  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 Electronically Signed   By: David  [place] M.D.   On: 05/18/2017 11:29    Discharge Exam: Vitals:   05/26/17 0528 05/26/17 1442  BP: (!) 143/81 (!) 141/77  Pulse: (!) 105 (!) 104  Resp: 18 19  Temp: 98.3 F (36.8 C) 97.9 F (36.6 C)   Vitals:   05/25/17 1355 05/25/17 2052 05/26/17 0528 05/26/17 1442  BP: 114/62 132/77 (!) 143/81 (!) 141/77  Pulse: (!) 102 99 (!) 105 (!) 104  Resp: 16 16 18 19   Temp: 98.2 F (36.8 C) 98.6 F (37 C) 98.3 F (36.8 C) 97.9 F (36.6 C)  TempSrc: Oral Oral Oral Oral  SpO2: 100% 100% 100% 100%  Weight:   67.3 kg (148 lb 4.8 oz)   Height:        General: Pt is alert, awake, not in acute distress Cardiovascular: RRR, S1/S2 +, no rubs, no gallops Respiratory: CTA bilaterally, no wheezing, no rhonchi Abdominal: Soft, NT, ND, bowel sounds + Extremities: no edema, no cyanosis   The results of significant diagnostics from this hospitalization (including imaging, microbiology, ancillary and  laboratory) are listed below for reference.     Microbiology: Recent Results (from the past 240 hour(s))  Urine culture     Status: Abnormal   Collection Time: 05/18/17 10:25 AM  Result Value Ref Range Status   Specimen Description URINE, RANDOM  Final   Special Requests NONE  Final   Culture >=100,000 COLONIES/mL ESCHERICHIA COLI (A)  Final   Report Status 05/20/2017 FINAL  Final   Organism ID, Bacteria ESCHERICHIA COLI (A)  Final      Susceptibility   Escherichia coli - MIC*    AMPICILLIN 8 SENSITIVE Sensitive     CEFAZOLIN <=4 SENSITIVE Sensitive     CEFTRIAXONE <=1 SENSITIVE Sensitive     CIPROFLOXACIN <=0.25 SENSITIVE Sensitive     GENTAMICIN <=1 SENSITIVE Sensitive     IMIPENEM <=0.25 SENSITIVE Sensitive     NITROFURANTOIN 32 SENSITIVE Sensitive     TRIMETH/SULFA <=20 SENSITIVE Sensitive     AMPICILLIN/SULBACTAM 4 SENSITIVE Sensitive     PIP/TAZO <=4 SENSITIVE Sensitive     Extended ESBL NEGATIVE Sensitive     * >=100,000 COLONIES/mL ESCHERICHIA COLI     Labs: BNP (last 3 results) No results for input(s): BNP in the last 8760 hours. Basic Metabolic Panel:  Recent Labs Lab 05/22/17 0518 05/23/17 0530 05/24/17 0629 05/25/17 0517 05/26/17 0649  NA 138 137 138 137 138  K 3.6 3.5 3.4* 3.6 3.5  CL 113* 111 112* 112* 112*  CO2 19* 17* 18* 17* 20*  GLUCOSE 104* 88 91 85 99  BUN 35* 32* 28* 25* 24*  CREATININE 2.73* 2.68* 2.32* 2.43* 2.59*  CALCIUM 8.4* 8.0* 7.9* 8.1* 8.3*   Liver Function Tests: No results for input(s): AST, ALT, ALKPHOS, BILITOT, PROT, ALBUMIN in the last 168 hours. No results for input(s): LIPASE, AMYLASE in the last 168 hours. No results for input(s): AMMONIA in the last 168 hours. CBC:  Recent Labs Lab 05/22/17 0518 05/23/17 0530 05/24/17 0629 05/24/17 1824 05/25/17 0517 05/26/17 0649  WBC 10.9* 10.7* 8.7  --  9.9 11.0*  NEUTROABS  --   --   --   --  7.9*  --   HGB 9.1* 7.3* 6.6* 8.2* 8.0* 8.1*  HCT 26.3* 20.8* 18.7* 23.3* 22.6*  22.9*  MCV 86.2 86.3 84.6  --  85.6 86.1  PLT 32* 38* 30*  --  38* 40*   Cardiac Enzymes: No results for input(s): CKTOTAL, CKMB, CKMBINDEX, TROPONINI in the last 168 hours. BNP: Invalid input(s): POCBNP CBG:  Recent Labs Lab 05/23/17 0741 05/24/17 0739 05/25/17 0726 05/26/17 0736 05/26/17 1145  GLUCAP 81 88 93 101* 96   D-Dimer No results for input(s): DDIMER in the last 72 hours. Hgb A1c No results for input(s): HGBA1C in the last 72 hours. Lipid Profile No results for input(s): CHOL, HDL, LDLCALC, TRIG, CHOLHDL, LDLDIRECT in the last 72 hours. Thyroid function studies  Recent Labs  05/24/17 0629  TSH 0.436   Anemia work up  Recent Labs  05/25/17 0517  VITAMINB12 439  FOLATE 6.9  FERRITIN 83  TIBC 162*  IRON 29*  RETICCTPCT 4.1*   Urinalysis    Component Value Date/Time   COLORURINE YELLOW 05/18/2017 1025   APPEARANCEUR TURBID (A) 05/18/2017 1025   LABSPEC 1.012 05/18/2017 1025   PHURINE 5.0 05/18/2017 1025   GLUCOSEU NEGATIVE 05/18/2017 1025   HGBUR LARGE (A) 05/18/2017 1025   BILIRUBINUR NEGATIVE 05/18/2017 1025   KETONESUR NEGATIVE 05/18/2017 1025   PROTEINUR 100 (A) 05/18/2017 1025   NITRITE NEGATIVE 05/18/2017 1025   LEUKOCYTESUR LARGE (A) 05/18/2017 1025   Sepsis Labs Invalid input(s): PROCALCITONIN,  WBC,  LACTICIDVEN Microbiology Recent Results (from the past 240 hour(s))  Urine culture     Status: Abnormal   Collection Time: 05/18/17 10:25 AM  Result Value Ref Range Status   Specimen Description URINE, RANDOM  Final   Special Requests NONE  Final   Culture >=100,000 COLONIES/mL ESCHERICHIA COLI (A)  Final   Report Status 05/20/2017 FINAL  Final   Organism ID, Bacteria ESCHERICHIA COLI (A)  Final      Susceptibility   Escherichia coli - MIC*    AMPICILLIN 8 SENSITIVE Sensitive     CEFAZOLIN <=4 SENSITIVE Sensitive     CEFTRIAXONE <=1 SENSITIVE Sensitive     CIPROFLOXACIN <=0.25 SENSITIVE Sensitive     GENTAMICIN <=1 SENSITIVE  Sensitive     IMIPENEM <=0.25 SENSITIVE Sensitive     NITROFURANTOIN 32 SENSITIVE Sensitive     TRIMETH/SULFA <=20 SENSITIVE Sensitive     AMPICILLIN/SULBACTAM 4 SENSITIVE Sensitive     PIP/TAZO <=4 SENSITIVE Sensitive     Extended ESBL NEGATIVE Sensitive     * >=100,000 COLONIES/mL ESCHERICHIA COLI     Time coordinating discharge: 40 minutes  SIGNED:  Latrelle Dodrill, MD  [facility] 05/26/2017, 4:09 PM  Pager please text page via  [email or web address] Password [email or web address]

## 2017-05-26 NOTE — Progress Notes (Signed)
[facility] Called for transport 

## 2017-05-26 NOTE — Progress Notes (Signed)
CSW spoke with patient daughter: Cristian SciaraVonia Gates [phone] about patient discharge to [facility]. CSW provided liaison contact info to schedule time to complete paperwork.  -Patient spouse is scheduled to fillout admission paperwork at 2:30-3:00 today Patient will transport by [facility]. D/C packet on chart. Pt. Can transfer after paperwork completed.  Please call report to: [phone] Waiting on D/C Summary  Cristian BarrackNicole Rees Gates, Cristian MajorsLCSWA, MSW Clinical Social Worker 5E and Psychiatric Service Line [phone] 05/26/2017  11:53 AM

## 2017-05-26 NOTE — Progress Notes (Signed)
Urine blood tinged - have asked nurse to remove catheter.  Will check on patient this afternoon.

## 2017-05-26 NOTE — Clinical Social Work Placement (Addendum)
CLINICAL SOCIAL WORK PLACEMENT  NOTE  Date:  05/26/2017  Patient Details  Name: Cristian Gates MRN: [id number] Date of Birth: 09/14/1941  Clinical Social Work is seeking post-discharge placement for this patient at the Skilled  Nursing Facility level of care (*CSW will initial, date and re-position this form in  chart as items are completed):  Yes   Patient/family provided with [facility] Clinical Social Work Department's list of facilities offering this level of care within the geographic area requested by the patient (or if unable, by the patient's family).  Yes   Patient/family informed of their freedom to choose among providers that offer the needed level of care, that participate in Medicare, Medicaid or managed care program needed by the patient, have an available bed and are willing to accept the patient.  Yes   Patient/family informed of [facility]'s ownership interest in [facility] and [facility], as well as of the fact that they are under no obligation to receive care at these facilities.  PASRR submitted to EDS on       PASRR number received on       Existing PASRR number confirmed on 05/26/17     FL2 transmitted to all facilities in geographic area requested by pt/family on       FL2 transmitted to all facilities within larger geographic area on       Patient informed that his/her managed care company has contracts with or will negotiate with certain facilities, including the following:  [facility]     Yes   Patient/family informed of bed offers received.  Patient [facility]     Physician recommends and patient chooses bed at      Patient to be [facility] on 05/26/17.  Patient to be transferred to facility by [facility]      Patient family notified on   of transfer. 05/26/2017  Name of family member notified:      Daughter  PHYSICIAN Please prepare priority discharge summary, including medications     Additional Comment:     _______________________________________________ Clearance CootsNicole A Natalee Tomkiewicz, LCSW 05/26/2017, 11:40 AM

## 2017-07-12 DEATH — deceased

## 2017-11-30 IMAGING — US US RENAL
1 series · 14 of 25 positions shown · non-contrast
Comparison: CT 04/01/2008

CLINICAL DATA: Hematuria

EXAM:
RENAL / URINARY TRACT ULTRASOUND COMPLETE

[Series 1: us renal · 0.22mm/px · 14 of 40 slices shown]
[im 1/40]
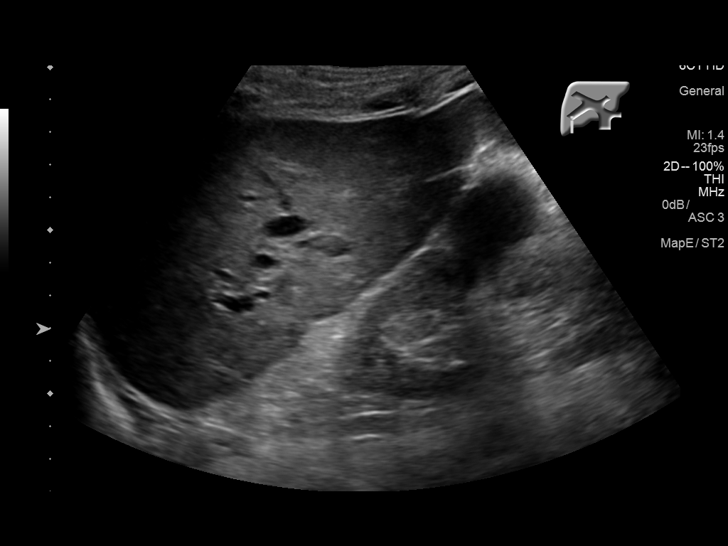
[im 4/40]
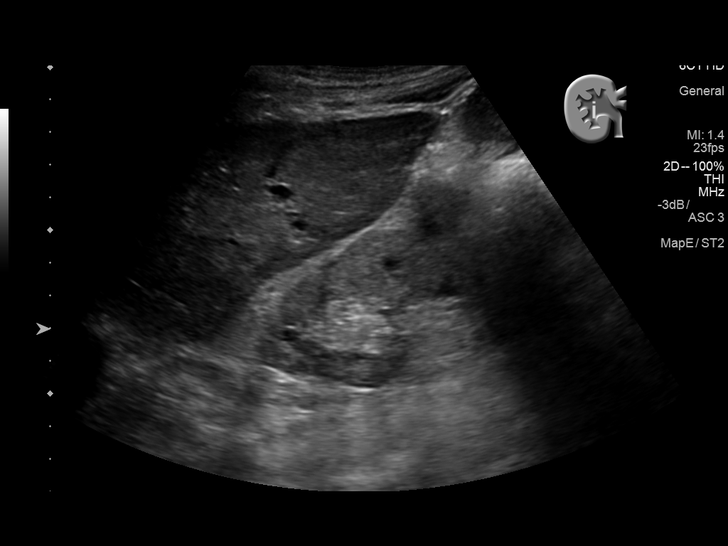
[im 7/40]
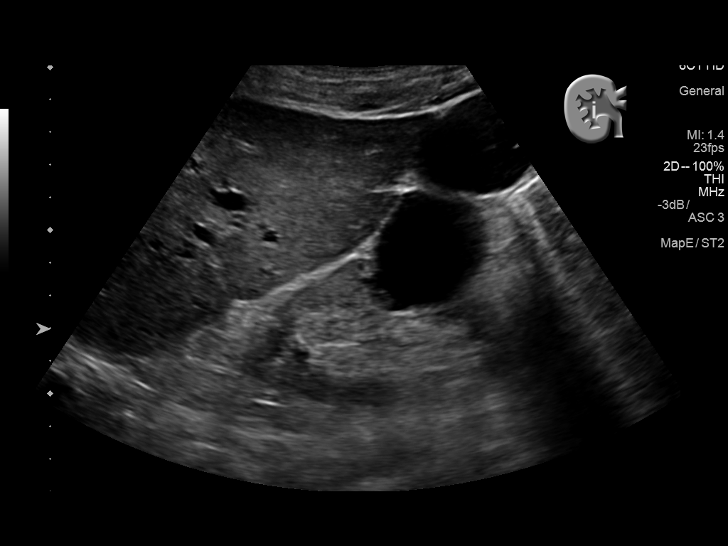
[im 10/40]
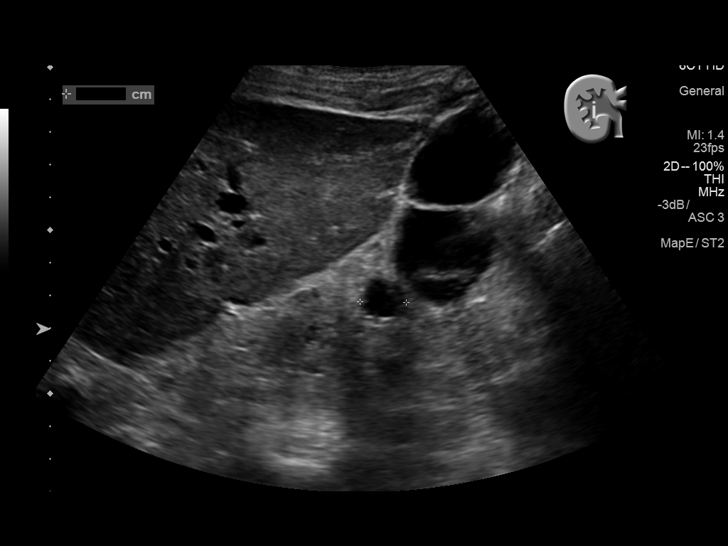
[im 14/40]
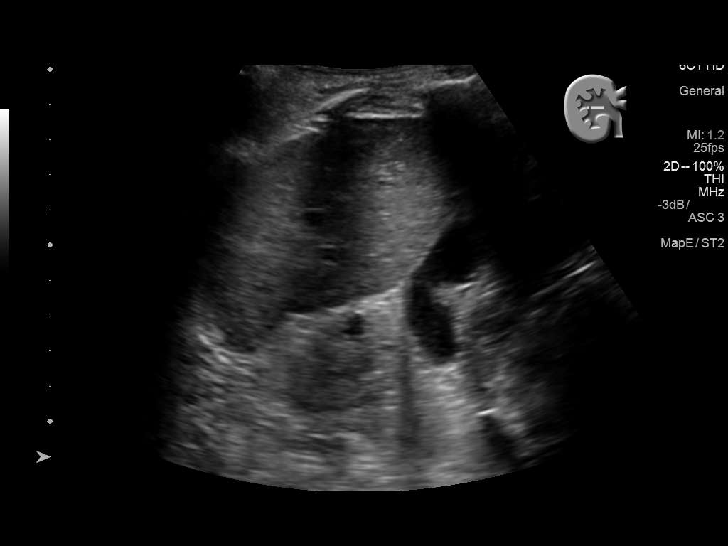
[im 15/40]
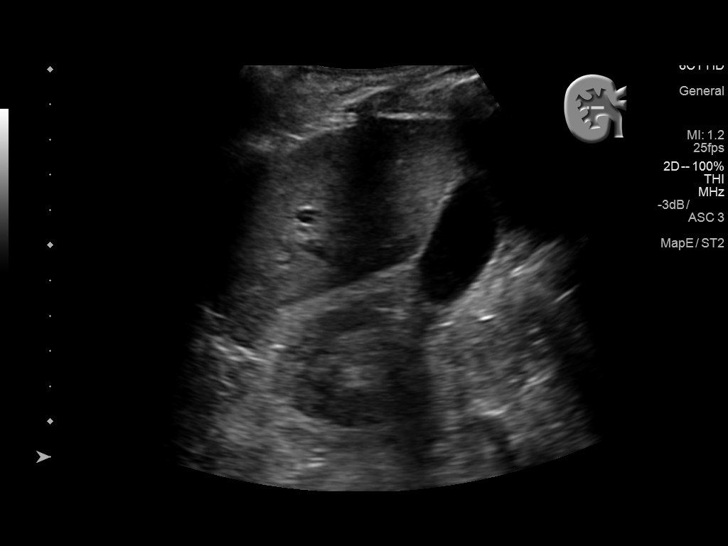
[im 18/40]
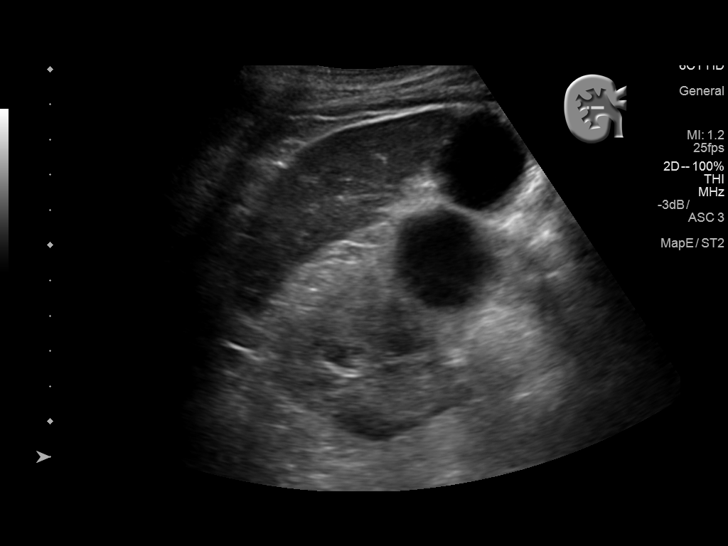
[im 22/40]
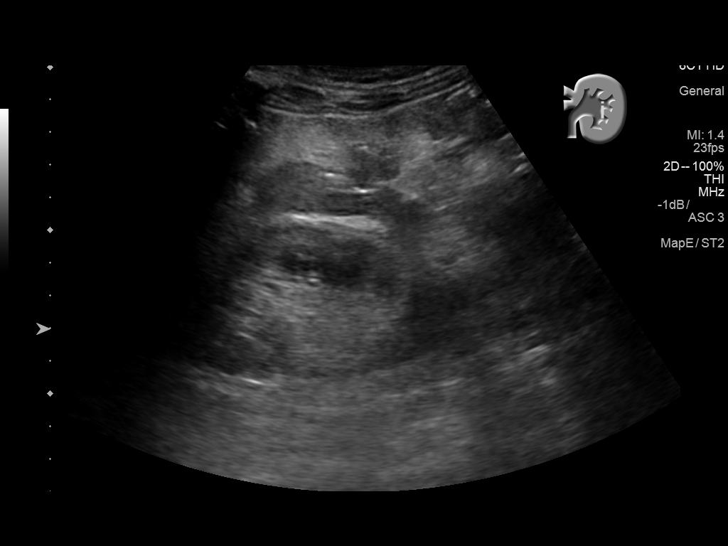
[im 25/40]
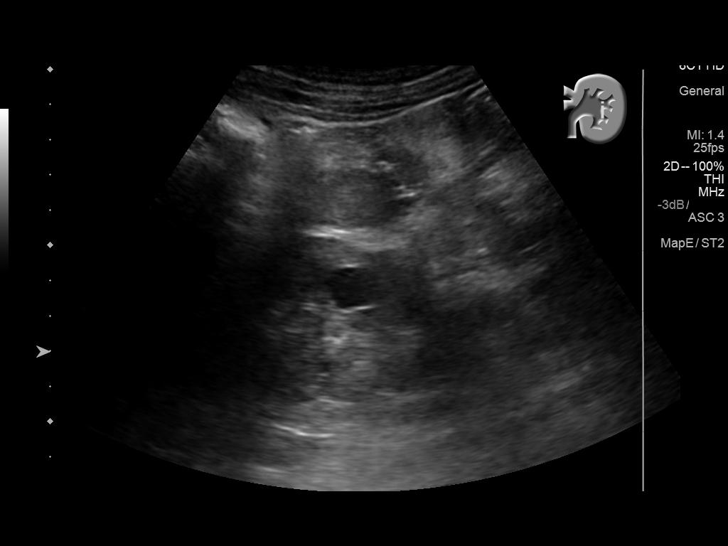
[im 27/40]
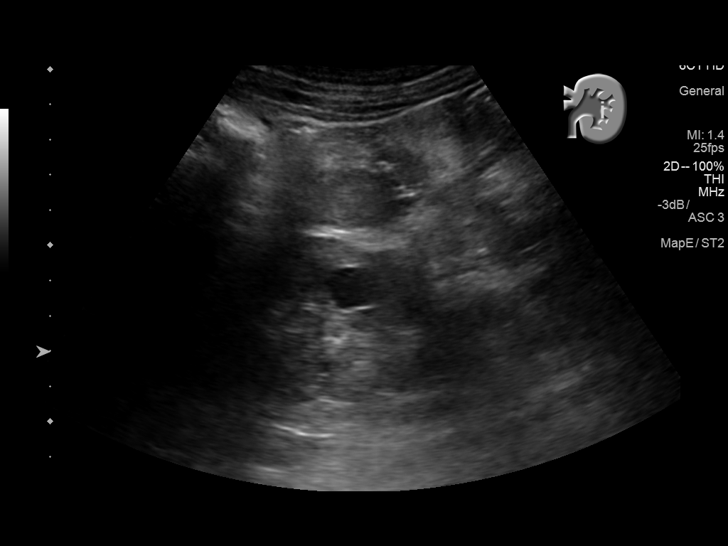
[im 30/40]
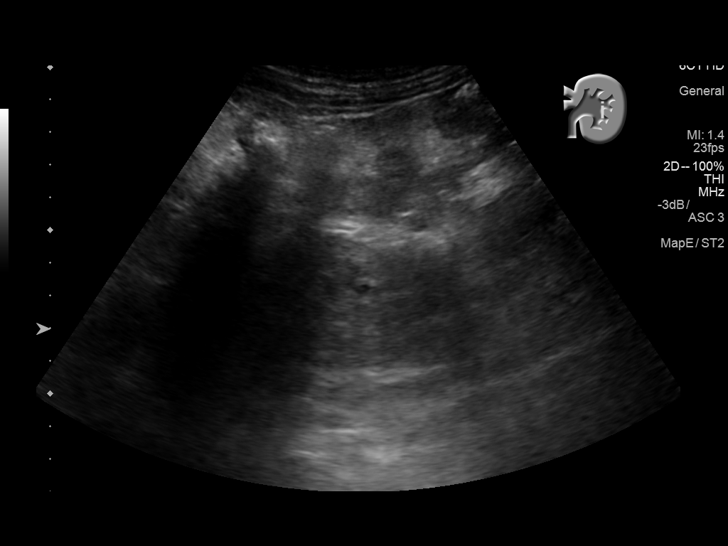
[im 33/40]
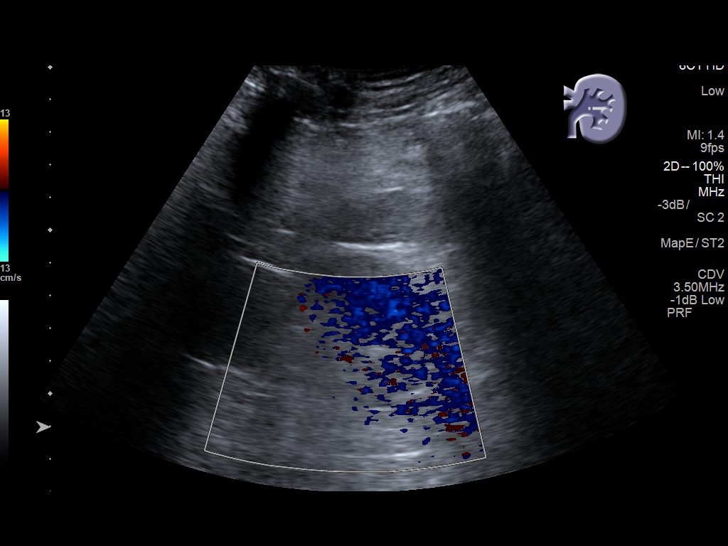
[im 36/40]
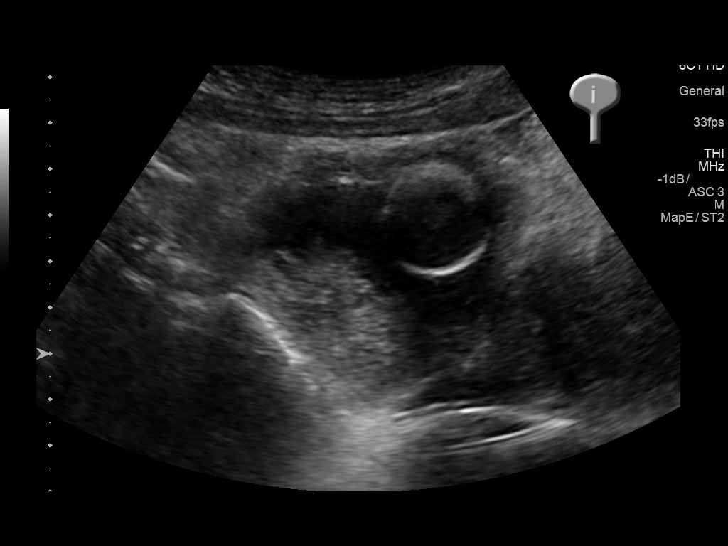
[im 40/40]
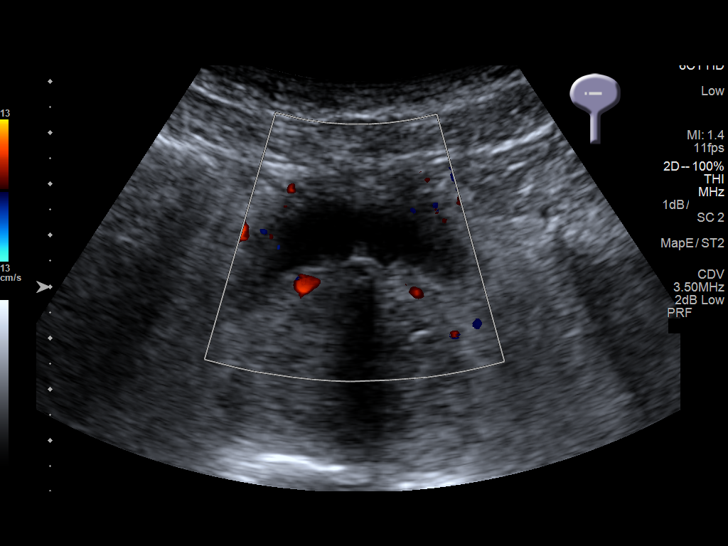

[14 of 25 positions shown; findings below may reference images not displayed]

FINDINGS: Right Kidney:

Length: 8.9 cm. Slightly atrophic. Two cysts in the midpole with the
larger measuring 3.5 cm in maximum diameter. There is no
hydronephrosis

Left Kidney:

Length: 8.6 cm. Slightly atrophic. Cysts in the mid to lower pole
measuring 1.5 cm.

Bladder:

Foley catheter is present in the bladder. There appears to be
echogenic material within the posterior bladder.
IMPRESSION: 1. Slightly atrophic kidneys. No hydronephrosis. Bilateral renal
cysts.
2. Suspect echogenic material in the posterior bladder which may
represent debris or possible thrombus.

## 2018-06-16 IMAGING — CR DG CHEST 2V
4 series · 4 of 4 positions shown · non-contrast
Comparison: None in PACs

CLINICAL DATA: Witnessed fall at home. Generalize weakness. History
of hypertension.

EXAM:
CHEST  2 VIEW

[x chest ap (1 of 2)]
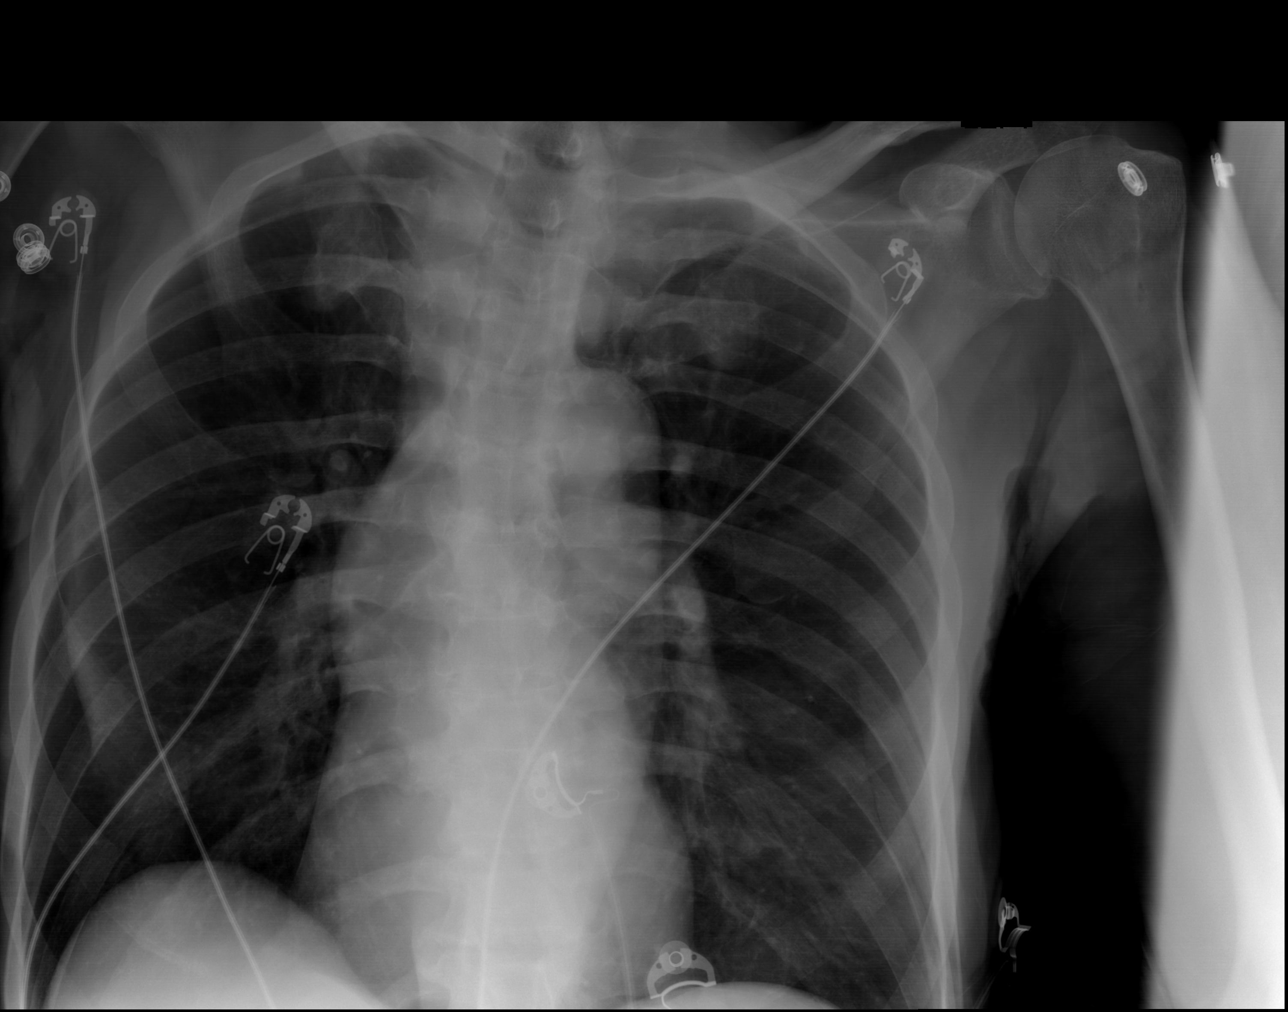

[x chest ap (2 of 2)]
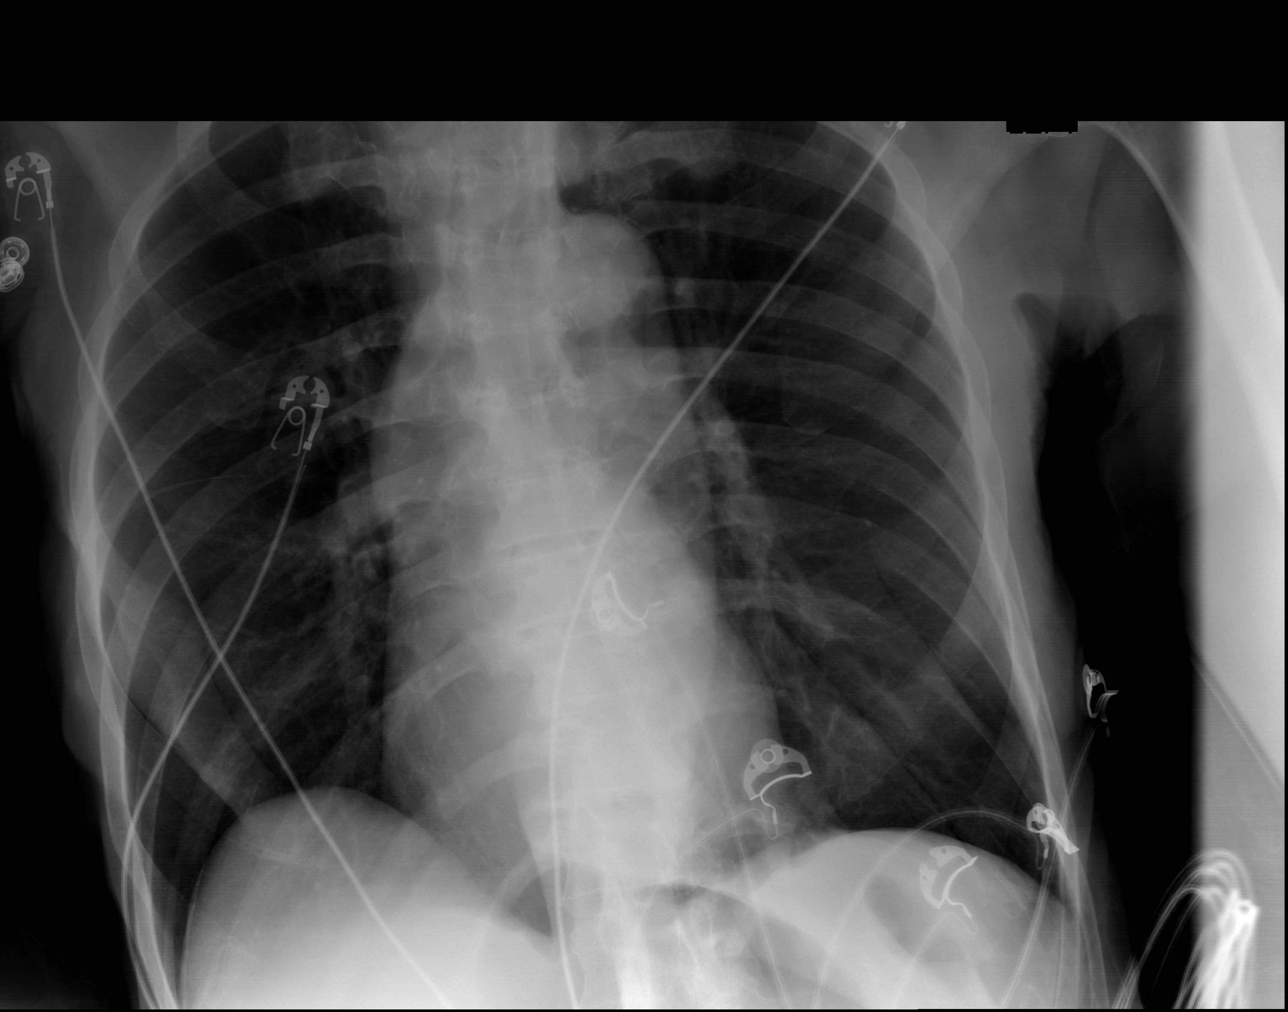

[w chest lat]
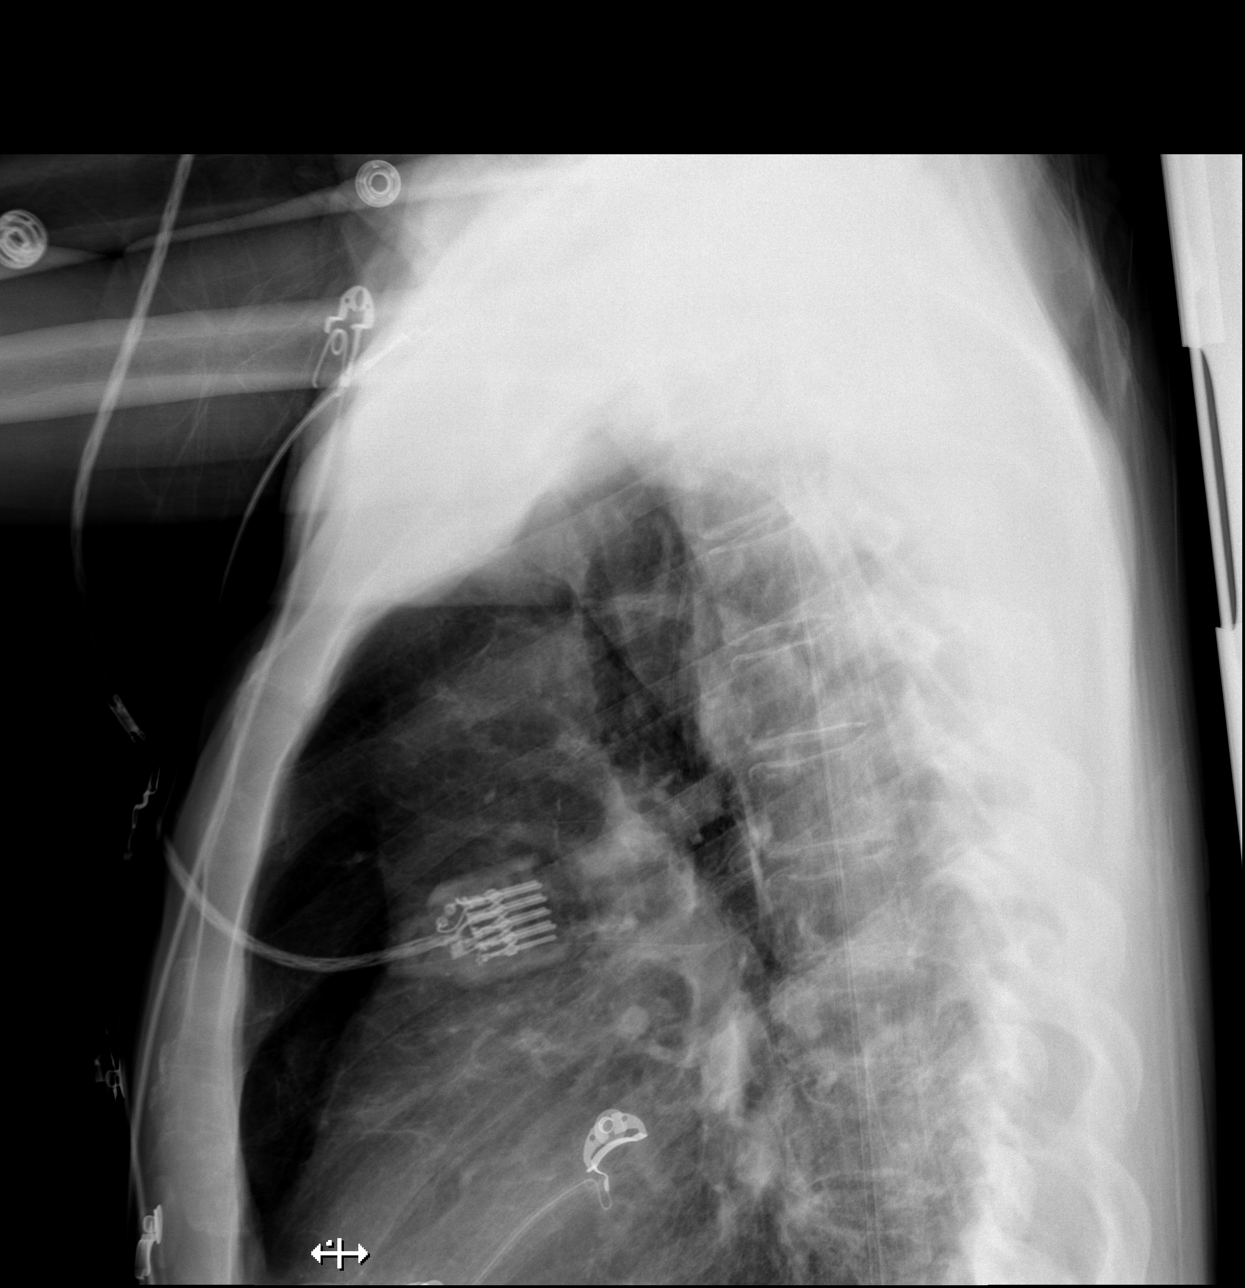

[w chest decub]
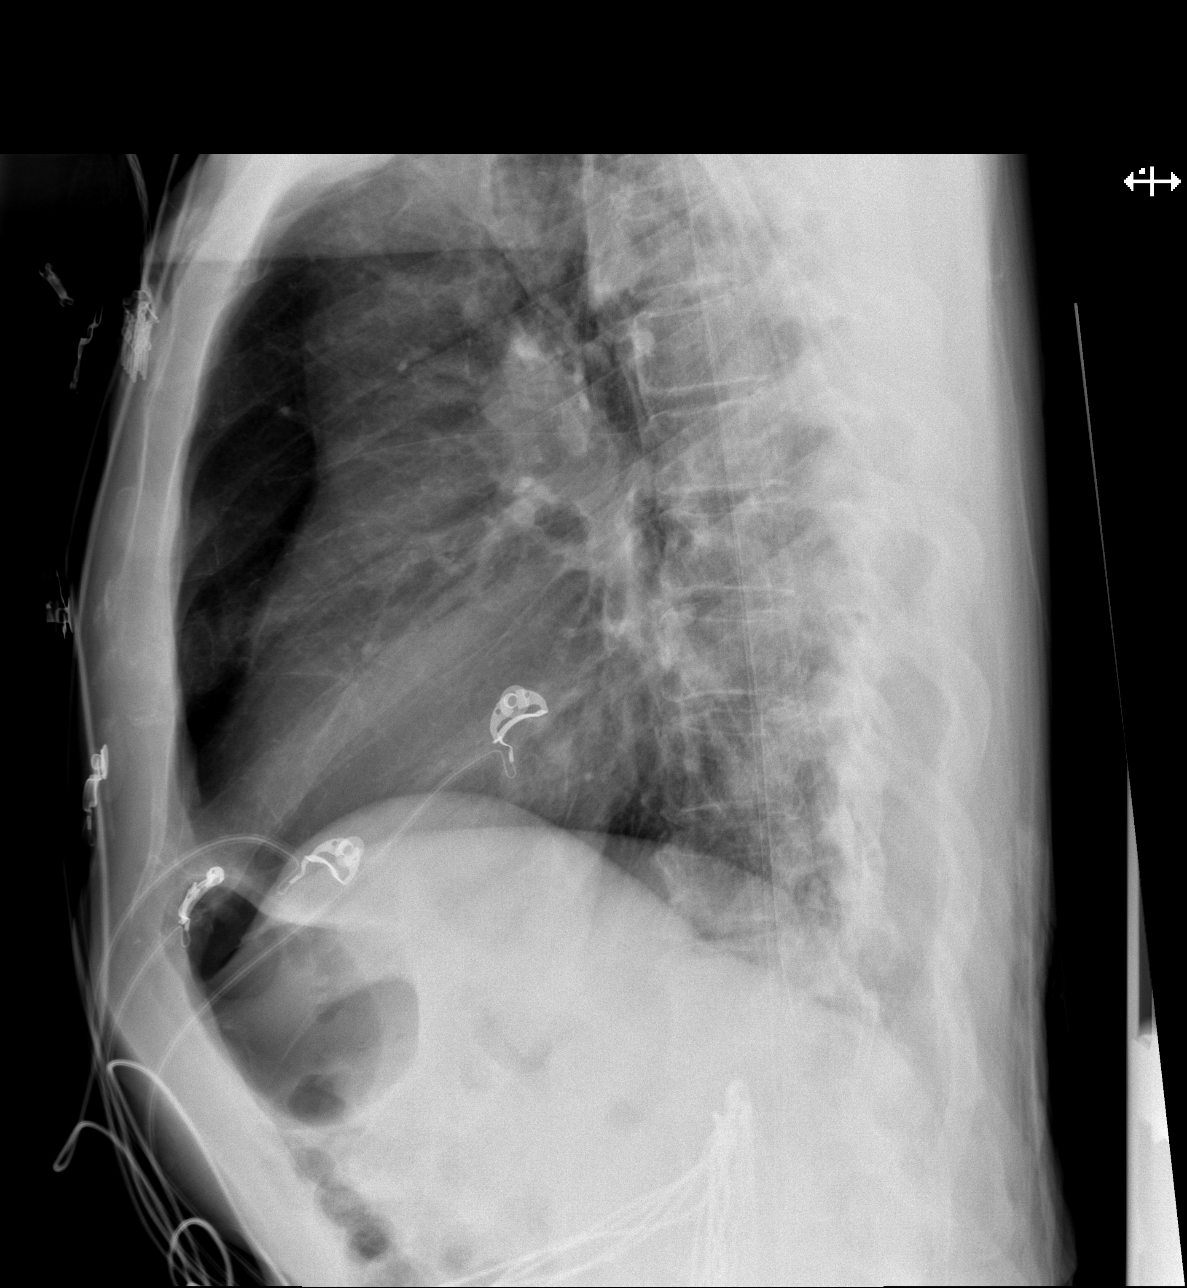

[4 of 4 positions shown; findings below may reference images not displayed]

FINDINGS: The lungs are well-expanded and clear. The heart and pulmonary
vascularity are normal. There is tortuosity of ascending and
descending thoracic aorta. There is mural calcification in the
aorta. The mediastinum is normal in width. There is no pleural
effusion. The observed bony thorax exhibits no acute abnormality.
IMPRESSION: There is no acute cardiopulmonary abnormality.

Thoracic aortic atherosclerosis.

## 2018-06-16 IMAGING — CT CT HEAD W/O CM
3 of 4 series · 15 of 47 positions shown, 18 images · non-contrast
Comparison: None.

CLINICAL DATA: Frequent falls.  Generalized weakness.

EXAM:
CT HEAD WITHOUT CONTRAST
TECHNIQUE: Contiguous axial images were obtained from the base of the skull
through the vertex without intravenous contrast.

[Series 2: sagittal · sagittal · 0.29mm/px · 3 of 55 slices shown]
[im 19/55  brain]
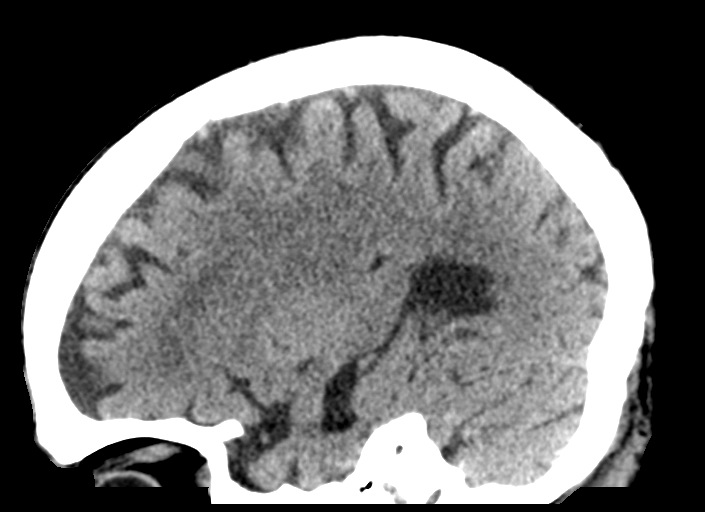
[im 28/55  brain]
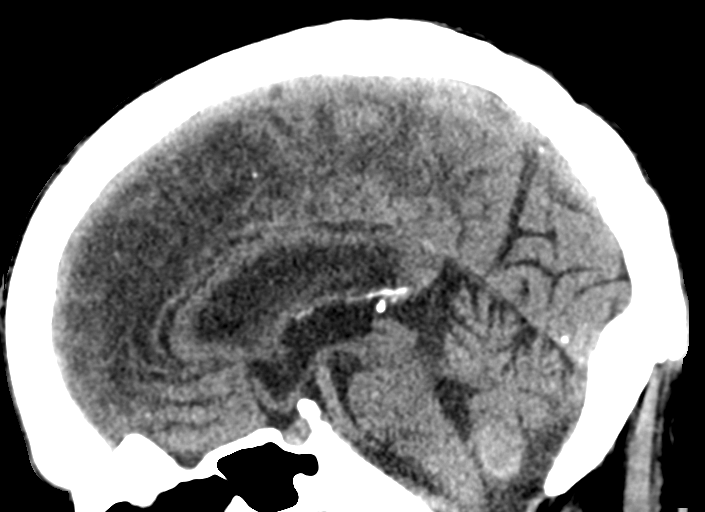
[im 37/55  brain]
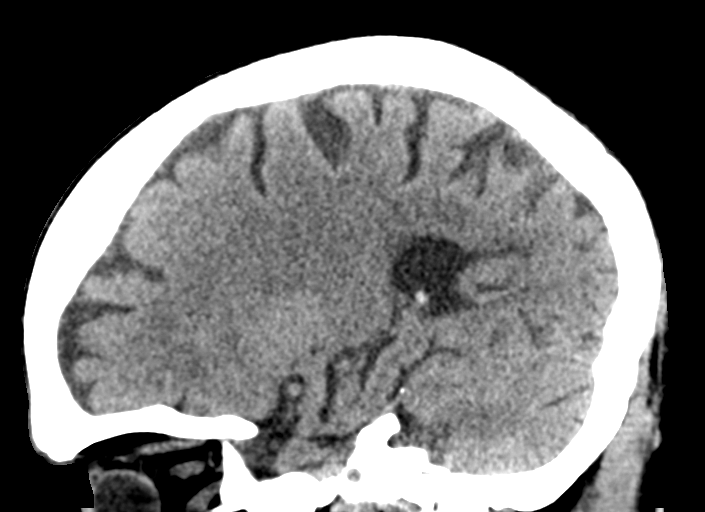

[Series 3: coronal · coronal · 0.29mm/px · 3 of 68 slices shown]
[im 23/68  brain]
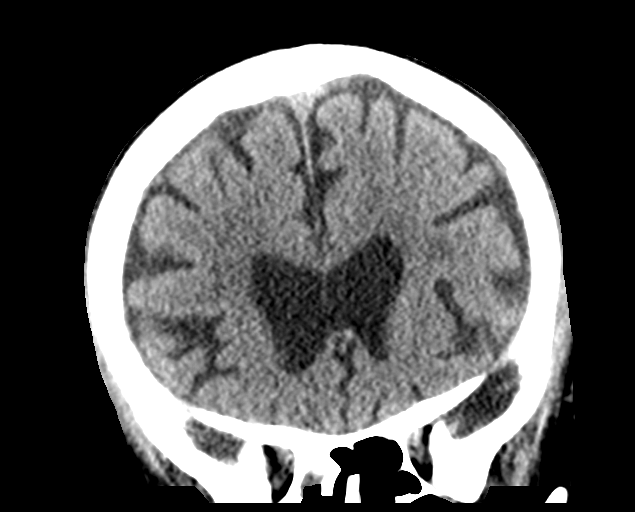
[im 30/68  brain]
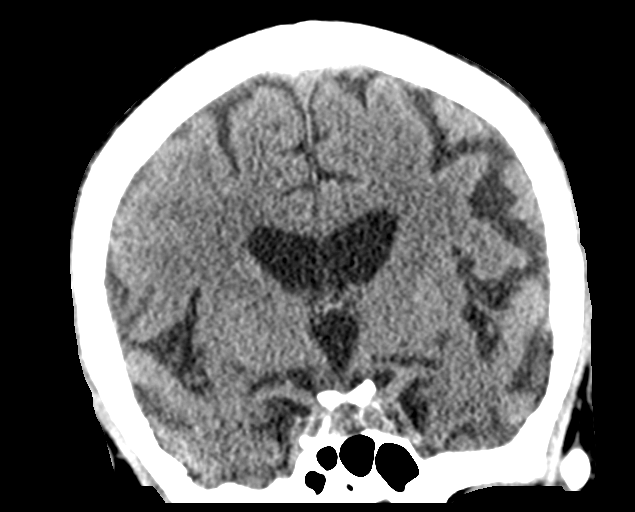
[im 38/68  brain]
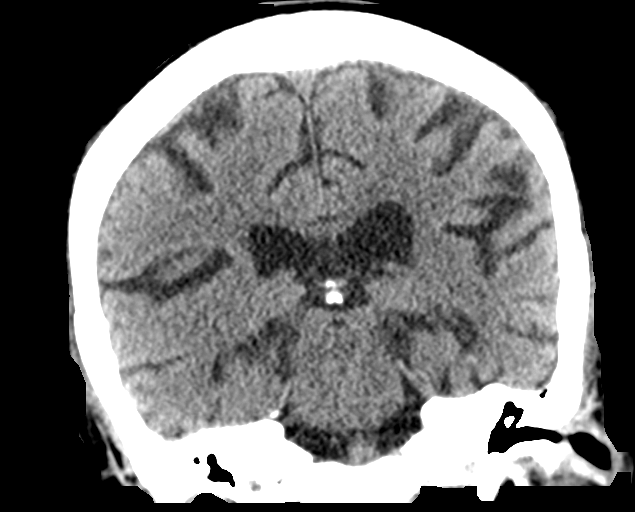

[Series 5: head w/o · axial · non-contrast · 0.43mm/px · z∈[-172,-52]mm · 9 of 30 slices shown, 12 images]
[im 3/30  brain]
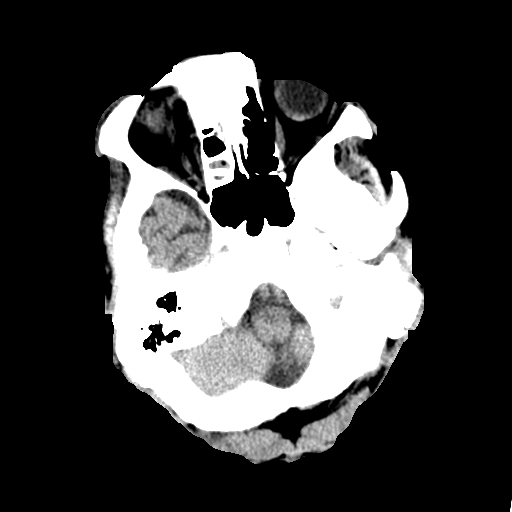
[im 3/30  bone]
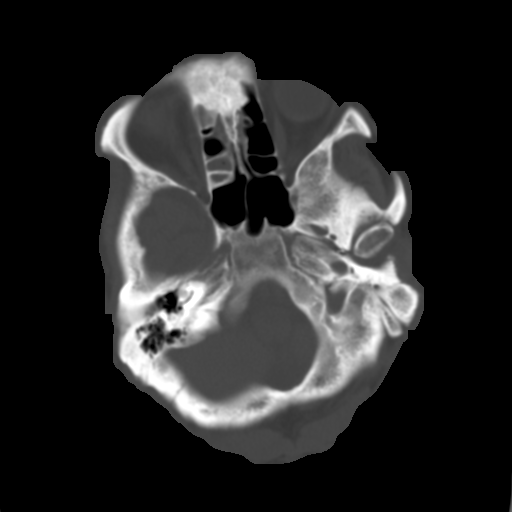
[im 7/30  brain]
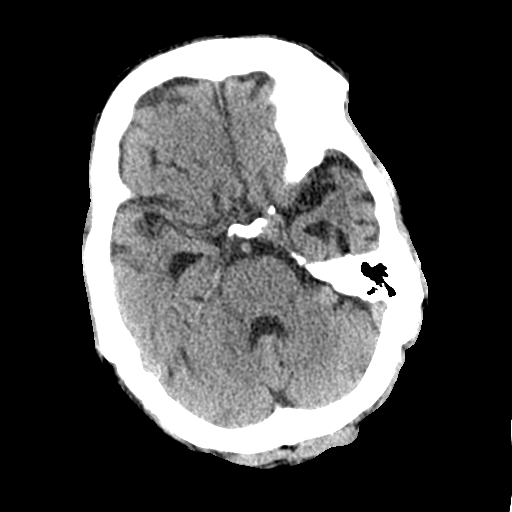
[im 9/30  brain]
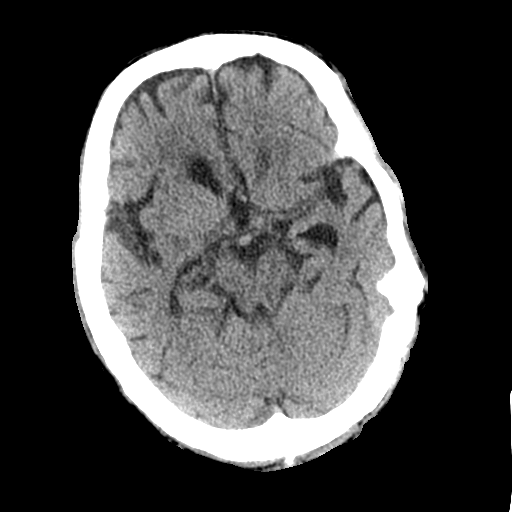
[im 13/30  brain]
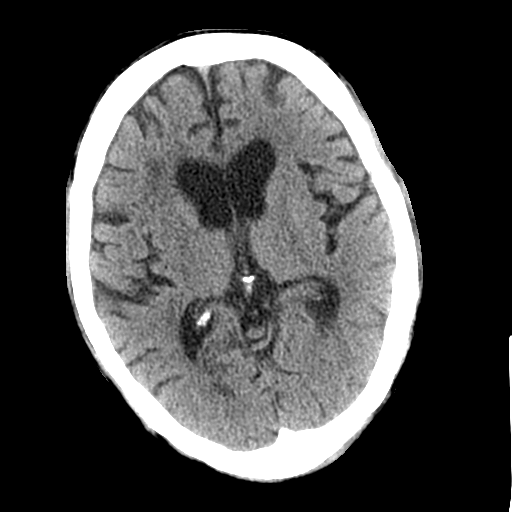
[im 15/30  brain]
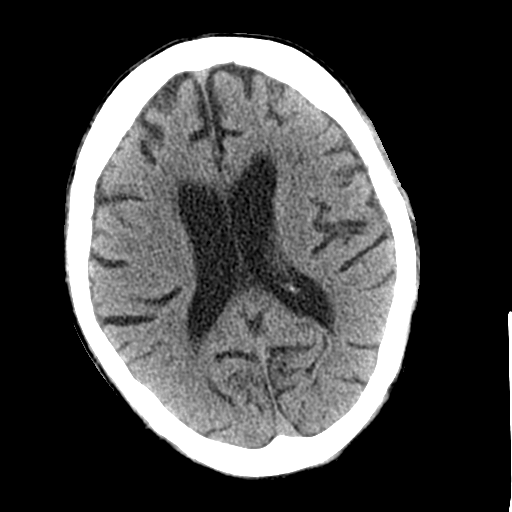
[im 15/30  bone]
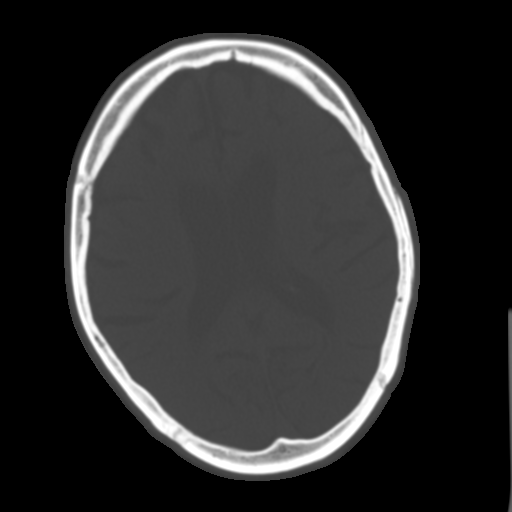
[im 17/30  brain]
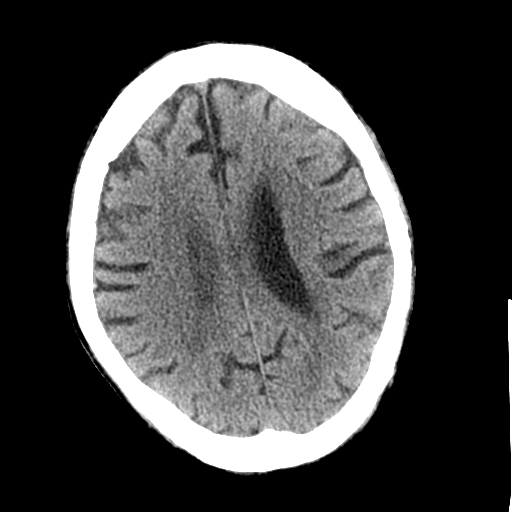
[im 21/30  brain]
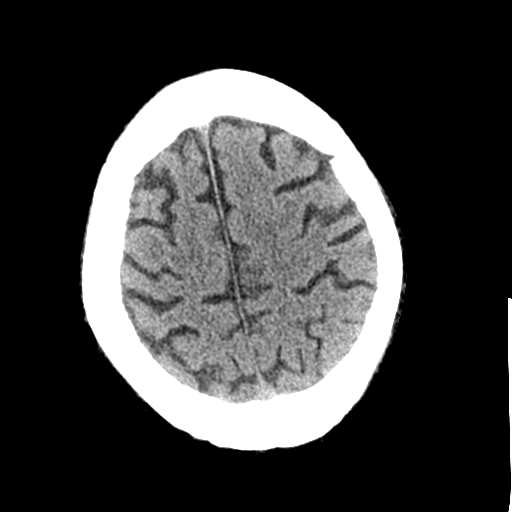
[im 23/30  brain]
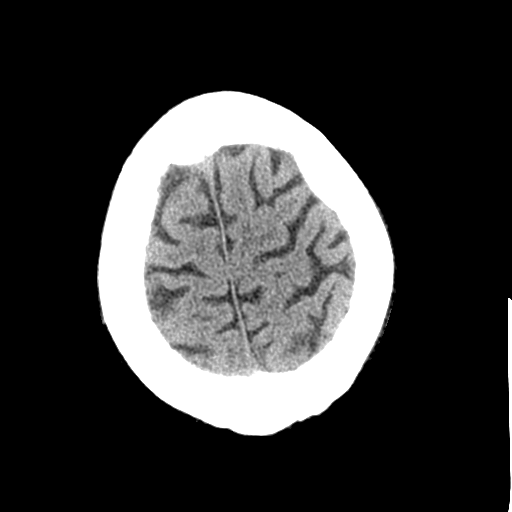
[im 27/30  brain]
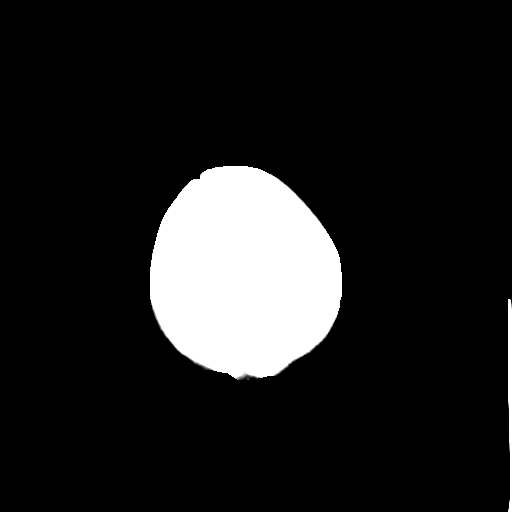
[im 27/30  bone]
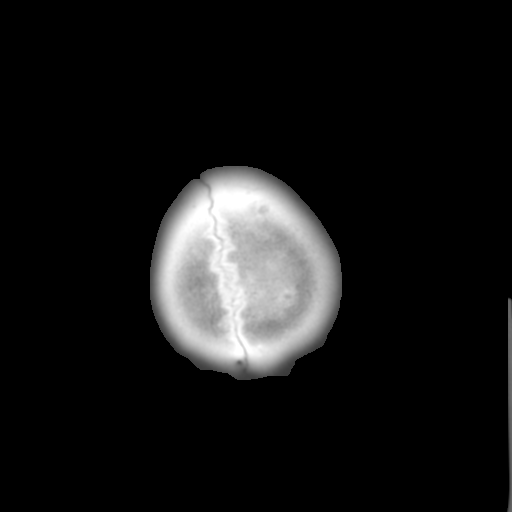

[15 of 47 positions shown; findings below may reference images not displayed]

FINDINGS: Brain: No evidence of acute infarction, hemorrhage, hydrocephalus,
extra-axial collection or mass lesion/mass effect. Mild-to-moderate
atrophy and mild chronic microvascular ischemic change in the
cerebral white matter.

Vascular: Atherosclerotic calcification.  No hyperdense vessel.

Skull: Negative

Sinuses/Orbits: Chronic appearing right ethmoid sinusitis.
IMPRESSION: 1. No acute finding
2. Atrophy and mild chronic microvascular ischemic change.
